# Patient Record
Sex: Male | Born: 1970
Health system: Southern US, Community
[De-identification: ages and names within clinical notes are randomized; demographics above are authoritative.]

## PROBLEM LIST (undated history)

## (undated) DIAGNOSIS — I10 Essential (primary) hypertension: Secondary | ICD-10-CM

## (undated) DIAGNOSIS — B353 Tinea pedis: Secondary | ICD-10-CM

## (undated) DIAGNOSIS — E119 Type 2 diabetes mellitus without complications: Secondary | ICD-10-CM

## (undated) DIAGNOSIS — E0590 Thyrotoxicosis, unspecified without thyrotoxic crisis or storm: Secondary | ICD-10-CM

## (undated) DIAGNOSIS — Z720 Tobacco use: Secondary | ICD-10-CM

## (undated) DIAGNOSIS — G4733 Obstructive sleep apnea (adult) (pediatric): Secondary | ICD-10-CM

## (undated) DIAGNOSIS — M1990 Unspecified osteoarthritis, unspecified site: Secondary | ICD-10-CM

## (undated) DIAGNOSIS — E669 Obesity, unspecified: Secondary | ICD-10-CM

## (undated) HISTORY — DX: Unspecified osteoarthritis, unspecified site: M19.90

## (undated) HISTORY — DX: Tinea pedis: B35.3

## (undated) HISTORY — DX: Obesity, unspecified: E66.9

## (undated) HISTORY — DX: Obstructive sleep apnea (adult) (pediatric): G47.33

## (undated) HISTORY — DX: Tobacco use: Z72.0

## (undated) HISTORY — DX: Thyrotoxicosis, unspecified without thyrotoxic crisis or storm: E05.90

## (undated) HISTORY — DX: Essential (primary) hypertension: I10

---

## 2005-02-08 ENCOUNTER — Encounter (HOSPITAL_COMMUNITY): Admission: RE | Admit: 2005-02-08 | Discharge: 2005-05-09 | Payer: Self-pay | Admitting: Endocrinology

## 2008-07-29 ENCOUNTER — Encounter: Admission: RE | Admit: 2008-07-29 | Discharge: 2008-07-29 | Payer: Self-pay | Admitting: Occupational Medicine

## 2009-01-07 ENCOUNTER — Emergency Department (HOSPITAL_COMMUNITY): Admission: EM | Admit: 2009-01-07 | Discharge: 2009-01-07 | Payer: Self-pay | Admitting: Emergency Medicine

## 2009-09-21 ENCOUNTER — Encounter: Admission: RE | Admit: 2009-09-21 | Discharge: 2009-09-21 | Payer: Self-pay | Admitting: Internal Medicine

## 2010-08-28 IMAGING — CR DG CHEST 2V
2 series · 2 of 2 positions shown · non-contrast
Comparison: None

CLINICAL DATA: Chest pain

CHEST - 2 VIEW

[w chest pa]
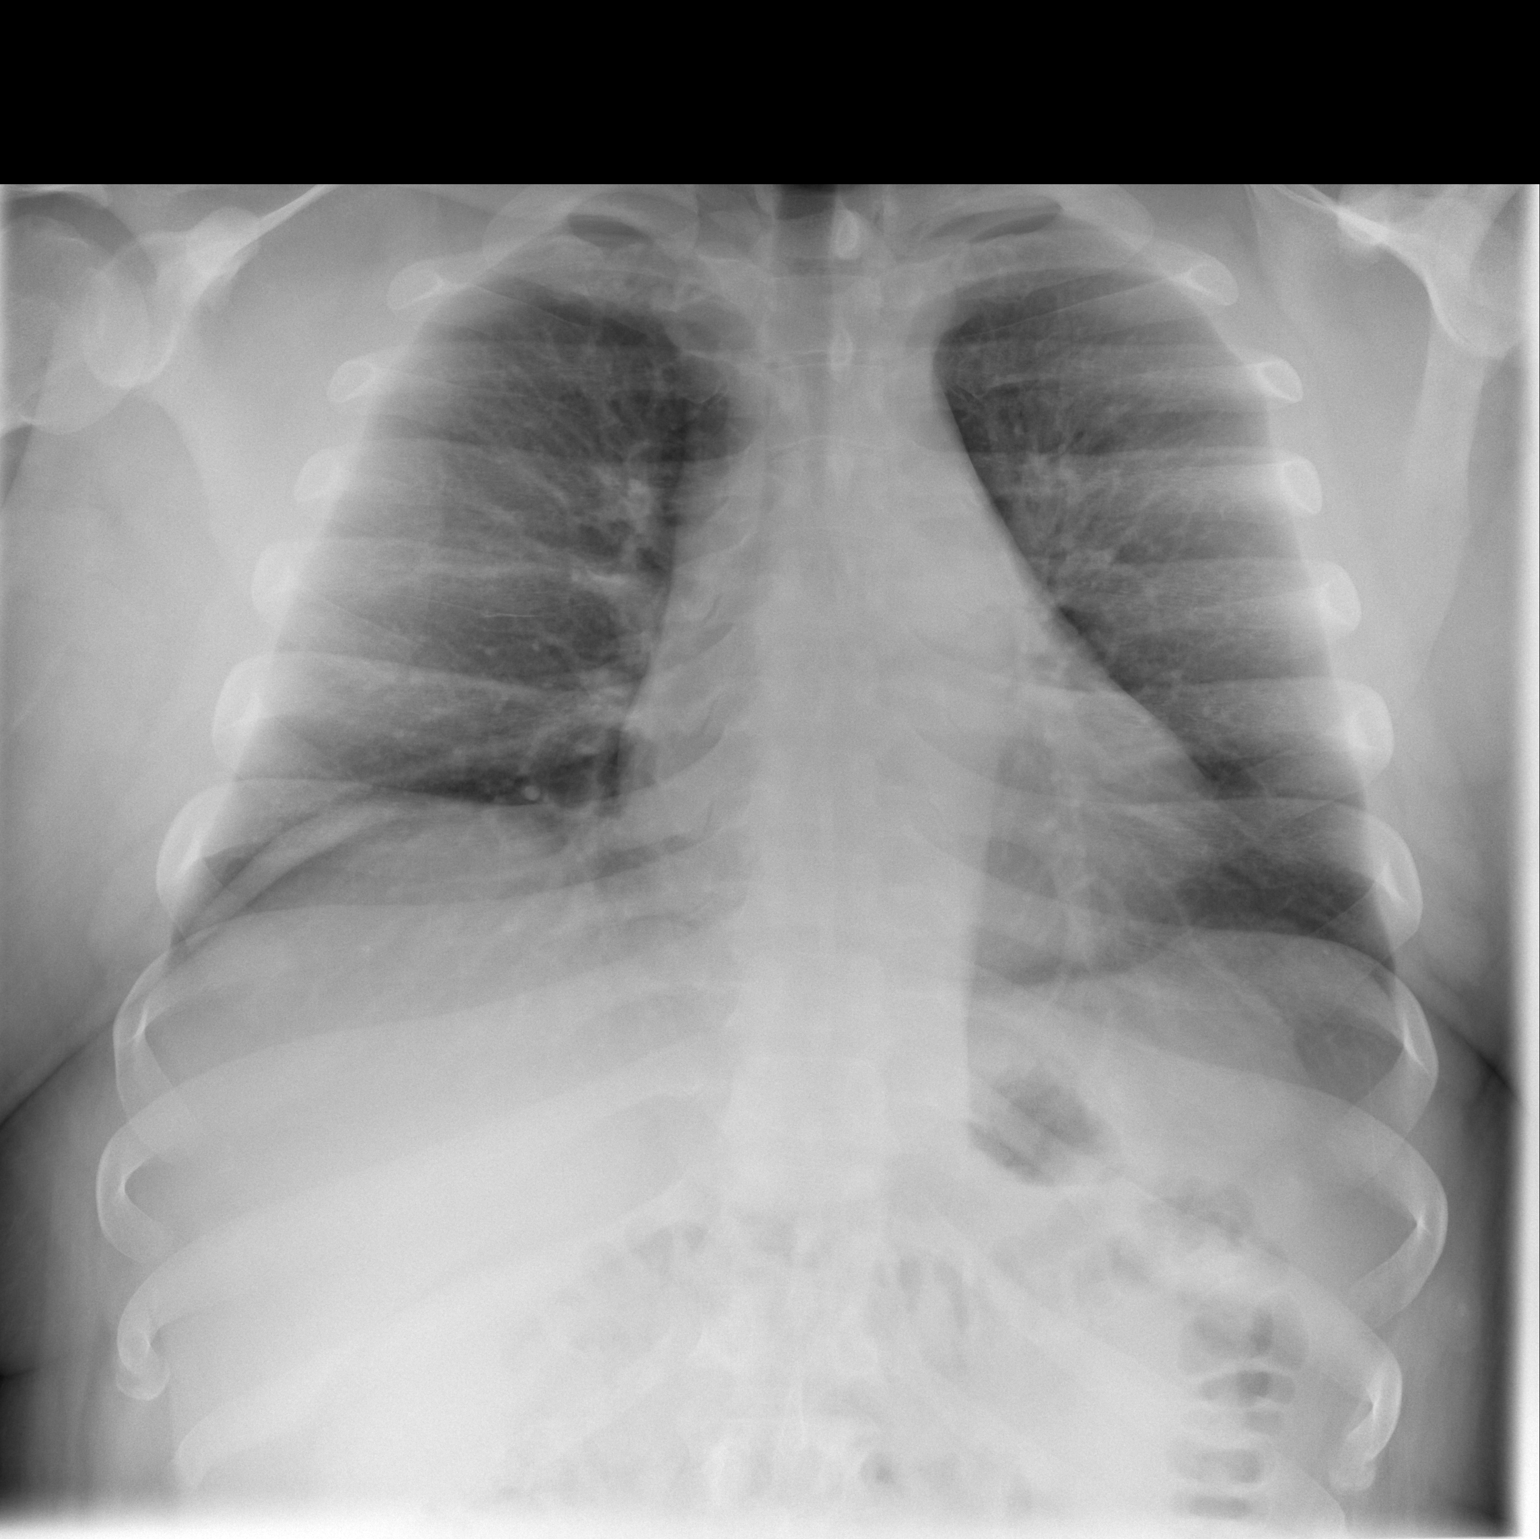

[w chest lat]
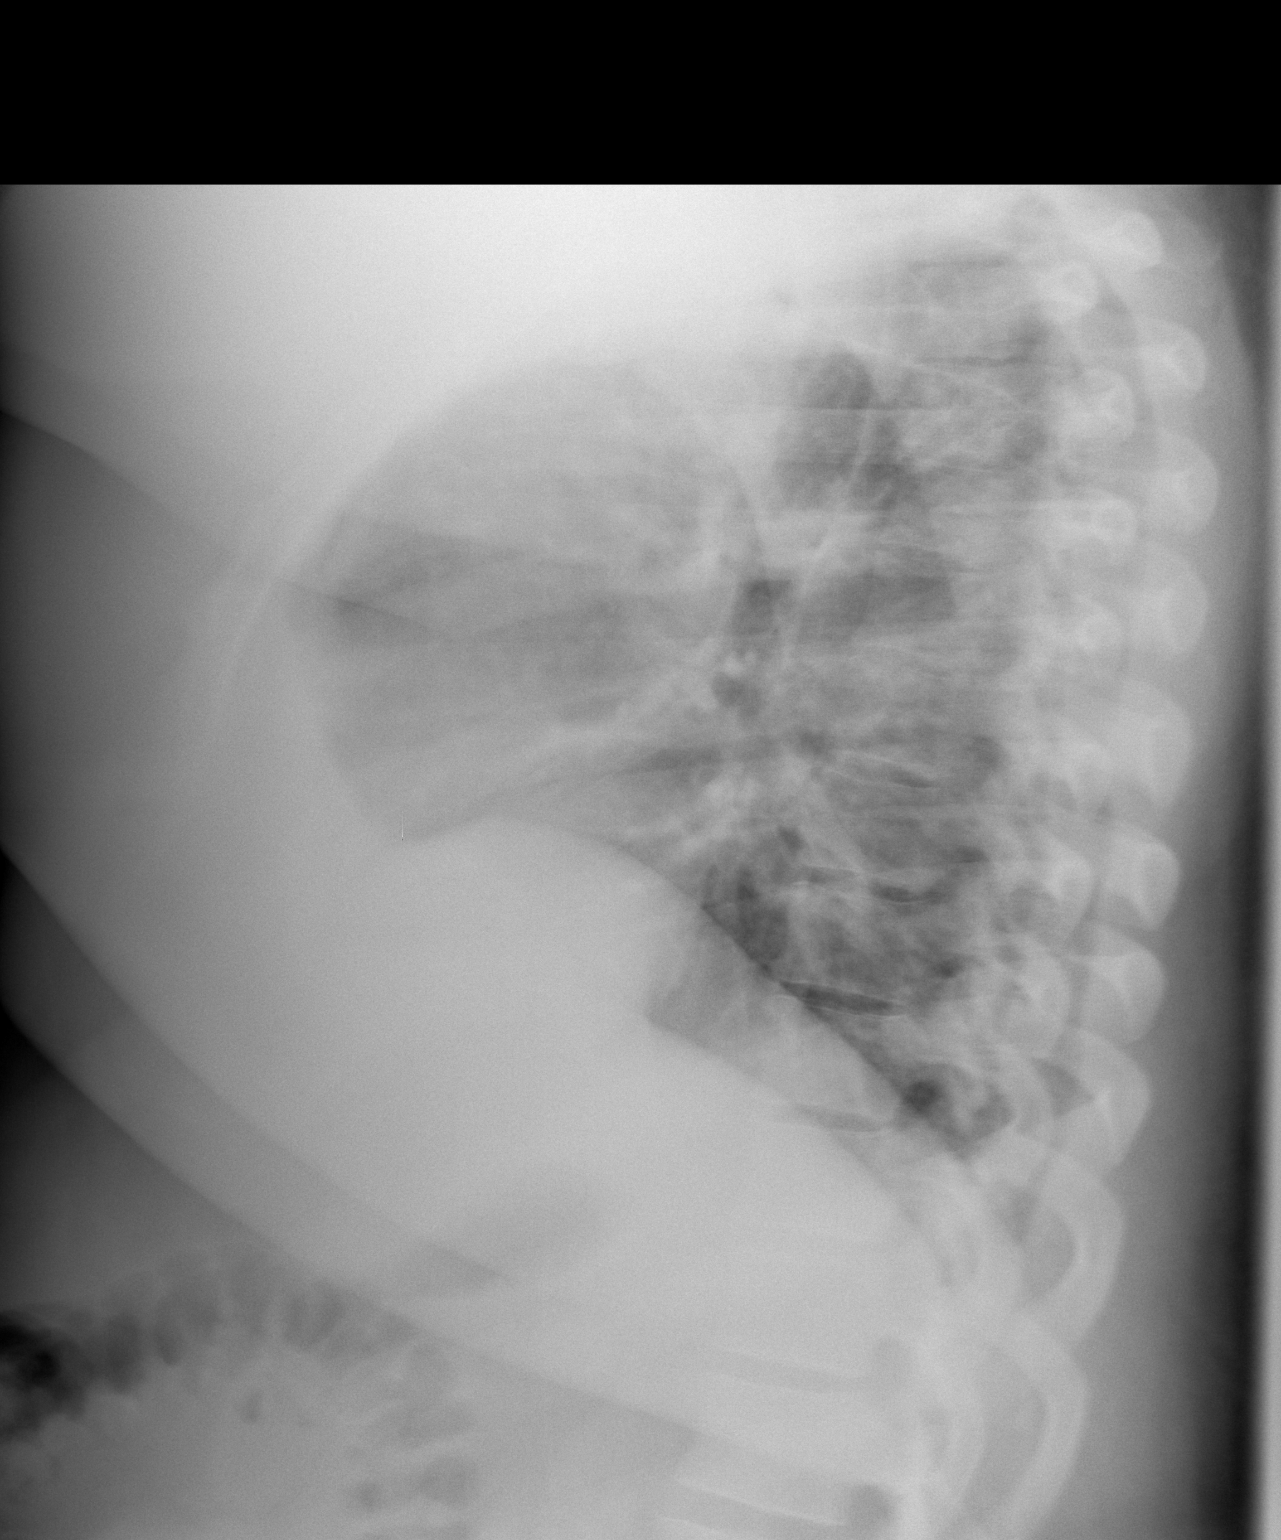

[2 of 2 positions shown; findings below may reference images not displayed]

FINDINGS: The heart size and mediastinal contours are within normal
limits.  Both lungs are clear.  The visualized skeletal structures
are unremarkable.
IMPRESSION: No acute cardiopulmonary abnormalities

## 2010-09-11 LAB — POCT I-STAT, CHEM 8
BUN: 11 mg/dL (ref 6–23)
Calcium, Ion: 0.98 mmol/L — ABNORMAL LOW (ref 1.12–1.32)
Creatinine, Ser: 0.9 mg/dL (ref 0.4–1.5)
Glucose, Bld: 98 mg/dL (ref 70–99)
Hemoglobin: 16.7 g/dL (ref 13.0–17.0)
Potassium: 4 mEq/L (ref 3.5–5.1)

## 2010-09-11 LAB — POCT CARDIAC MARKERS: CKMB, poc: 1.7 ng/mL (ref 1.0–8.0)

## 2011-01-07 ENCOUNTER — Ambulatory Visit
Admission: RE | Admit: 2011-01-07 | Discharge: 2011-01-07 | Disposition: A | Discharge status: Home / Self Care | Payer: 59 | Source: Ambulatory Visit | Attending: Family Medicine | Admitting: Family Medicine

## 2011-01-07 ENCOUNTER — Other Ambulatory Visit: Payer: Self-pay | Admitting: Family Medicine

## 2011-01-07 DIAGNOSIS — G44009 Cluster headache syndrome, unspecified, not intractable: Secondary | ICD-10-CM

## 2011-06-17 ENCOUNTER — Ambulatory Visit: Payer: 59 | Admitting: Family Medicine

## 2011-08-11 ENCOUNTER — Ambulatory Visit (INDEPENDENT_AMBULATORY_CARE_PROVIDER_SITE_OTHER): Payer: 59 | Admitting: Physician Assistant

## 2011-08-11 VITALS — BP 135/89 | HR 68 | Temp 98.2°F | Resp 20 | Ht 67.25 in | Wt 306.2 lb

## 2011-08-11 DIAGNOSIS — J069 Acute upper respiratory infection, unspecified: Secondary | ICD-10-CM

## 2011-08-11 DIAGNOSIS — H109 Unspecified conjunctivitis: Secondary | ICD-10-CM

## 2011-08-11 MED ORDER — KETOROLAC TROMETHAMINE 0.5 % OP SOLN: 1.0000 [drp] | Freq: Four times a day (QID) | OPHTHALMIC | Status: AC

## 2011-08-11 MED ORDER — IPRATROPIUM BROMIDE 0.03 % NA SOLN: 2.0000 | Freq: Two times a day (BID) | NASAL | Status: DC

## 2011-08-11 NOTE — Progress Notes (Signed)
Subjective:    Patient ID: Patrick Russell, male    DOB: [birth year], 41 y.o.   MRN: [id number]  HPI  This patient presents, accompanied by his wife, complaining of irritation and swelling of the left eye since approximately 2 AM. It woke him from his sleep. He denies any history of foreign body to the eye. No history of injury to the eye. Decreased visual acuity but light causes burning sensation in the eye. This morning the eye appeared red and it was crusted around the lashes. Since he cleans the lashes he has had clear drainage, like tears, draining but no purulence. He went to work but was sent home with possible pinkeye.  Review of Systems Nasal congestion, postnasal drainage, sinus pressure. Ear pain. Mild headache more on the left than the right. No cough. No chest pain, shortness of breath, GI or GU symptoms.    Objective:   Physical Exam Vital signs are noted. This is a well-developed, obese black male who is awake, alert and oriented in no acute distress. Swelling of the left upper eyelid is noted. The sclera and conjunctiva of the left eye are mildly injected. No cloudiness of the cornea. Her equal, round and reactive to light. Extraocular movements are intact. Funduscopic exam is normal. There is no purulence noted. Nasal mucosa is congested. Oropharynx is clear. TMs are clear bilaterally. No periarticular or cervical lymphadenopathy. Heart has a regular rate and rhythm. Lungs are clear bilaterally. Skin is warm and dry.       Assessment & Plan:  Viral conjunctivitis, early viral URI.  Acular ophthalmic drops 4 times a day. Atrovent nasal spray twice a day. Presses to the left eyelid. Reevaluate promptly if symptoms worsen or persist or he develops joint changes.

## 2011-08-11 NOTE — Patient Instructions (Signed)
Warm compress to the affected eye 2-3 times daily.  WASH YOUR HANDS! And try not to touch your eye (we don't want to get a bacterial infection on top!).

## 2011-08-25 ENCOUNTER — Encounter: Payer: 59 | Admitting: Physician Assistant

## 2011-09-23 ENCOUNTER — Other Ambulatory Visit: Payer: Self-pay | Admitting: Physician Assistant

## 2011-09-29 ENCOUNTER — Ambulatory Visit (INDEPENDENT_AMBULATORY_CARE_PROVIDER_SITE_OTHER): Payer: 59 | Admitting: Physician Assistant

## 2011-09-29 ENCOUNTER — Encounter: Payer: Self-pay | Admitting: Physician Assistant

## 2011-09-29 VITALS — BP 143/93 | HR 83 | Temp 97.3°F | Resp 16 | Ht 67.25 in | Wt 306.6 lb

## 2011-09-29 DIAGNOSIS — M109 Gout, unspecified: Secondary | ICD-10-CM

## 2011-09-29 DIAGNOSIS — R030 Elevated blood-pressure reading, without diagnosis of hypertension: Secondary | ICD-10-CM

## 2011-09-29 DIAGNOSIS — Z125 Encounter for screening for malignant neoplasm of prostate: Secondary | ICD-10-CM

## 2011-09-29 DIAGNOSIS — E669 Obesity, unspecified: Secondary | ICD-10-CM

## 2011-09-29 DIAGNOSIS — Z0000 Encounter for general adult medical examination without abnormal findings: Secondary | ICD-10-CM

## 2011-09-29 DIAGNOSIS — M25569 Pain in unspecified knee: Secondary | ICD-10-CM

## 2011-09-29 DIAGNOSIS — Z1211 Encounter for screening for malignant neoplasm of colon: Secondary | ICD-10-CM

## 2011-09-29 LAB — POCT URINALYSIS DIPSTICK
Bilirubin, UA: NEGATIVE
Nitrite, UA: NEGATIVE
Protein, UA: NEGATIVE
Spec Grav, UA: 1.025

## 2011-09-29 LAB — LIPID PANEL
HDL: 55 mg/dL (ref >39)
LDL Cholesterol: 120 mg/dL — ABNORMAL HIGH (ref 0–99)
Total CHOL/HDL Ratio: 4 Ratio
Triglycerides: 218 mg/dL — ABNORMAL HIGH (ref <150)
VLDL: 44 mg/dL — ABNORMAL HIGH (ref 0–40)

## 2011-09-29 LAB — POCT UA - MICROSCOPIC ONLY
Bacteria, U Microscopic: NEGATIVE
Casts, Ur, LPF, POC: NEGATIVE
Crystals, Ur, HPF, POC: NEGATIVE
Yeast, UA: NEGATIVE

## 2011-09-29 LAB — COMPREHENSIVE METABOLIC PANEL
ALT: 35 U/L (ref 0–53)
AST: 31 U/L (ref 0–37)
Albumin: 4.9 g/dL (ref 3.5–5.2)
BUN: 13 mg/dL (ref 6–23)
Calcium: 9.4 mg/dL (ref 8.4–10.5)
Creat: 0.79 mg/dL (ref 0.50–1.35)
Sodium: 140 mEq/L (ref 135–145)
Total Bilirubin: 0.6 mg/dL (ref 0.3–1.2)

## 2011-09-29 LAB — CBC WITH DIFFERENTIAL/PLATELET
Basophils Absolute: 0.1 10*3/uL (ref 0.0–0.1)
Eosinophils Absolute: 0.2 10*3/uL (ref 0.0–0.7)
Eosinophils Relative: 2 % (ref 0–5)
HCT: 44.1 % (ref 39.0–52.0)
Hemoglobin: 14.6 g/dL (ref 13.0–17.0)
Lymphocytes Relative: 27 % (ref 12–46)
MCHC: 33.1 g/dL (ref 30.0–36.0)
MCV: 80.5 fL (ref 78.0–100.0)
Monocytes Absolute: 0.7 10*3/uL (ref 0.1–1.0)
RBC: 5.48 MIL/uL (ref 4.22–5.81)
WBC: 8.3 10*3/uL (ref 4.0–10.5)

## 2011-09-29 LAB — PSA: PSA: 1.05 ng/mL (ref <=4.00)

## 2011-09-29 NOTE — Progress Notes (Signed)
Subjective:    Patient ID: Patrick Russell, male    DOB: 1970/08/28, 41 y.o.   MRN: [id number]  HPI Presents for re-evaluation of HTN.  Noted at his recent visit with conjunctivitis.  Previously told he had HTN, but no treatment in quite some time. He smokes.  Working on quitting.  Also knows he needs to lose weight.   Review of Systems No chest pain, SOB, HA, dizziness, vision change, N/V, diarrhea, dysuria, myalgias, arthralgias or rash.     Objective:   Physical Exam  Vital signs noted. Well-developed, well nourished obese BM who is awake, alert and oriented, in NAD. HEENT: [place]/AT, PERRL, EOMI.  Sclera and conjunctiva are clear.  EAC are patent, TMs are normal in appearance. Nasal mucosa is pink and moist. OP is clear. Neck: supple, non-tender, no lymphadenopathey, thyromegaly. Heart: RRR, no murmur Lungs: CTA Abdomen: normo-active bowel sounds, supple, non-tender, no mass or organomegaly. Extremities: no cyanosis, clubbing or edema. Skin: warm and dry without rash. Hyperpigmentation on the central forehead consistent with sleeping prone with forehead rested on hands.  Results for orders placed in visit on 09/29/11  IFOBT (OCCULT BLOOD)      Component Value Range   IFOBT Negative    POCT UA - MICROSCOPIC ONLY      Component Value Range   WBC, Ur, HPF, POC 0-2     RBC, urine, microscopic 1-3     Bacteria, U Microscopic neg     Mucus, UA moderate     Epithelial cells, urine per micros 0-2     Crystals, Ur, HPF, POC neg     Casts, Ur, LPF, POC neg     Yeast, UA neg    POCT URINALYSIS DIPSTICK      Component Value Range   Color, UA yellow     Clarity, UA clear     Glucose, UA neg     Bilirubin, UA neg     Ketones, UA neg     Spec Grav, UA 1.025     Blood, UA trace     pH, UA 5.5     Protein, UA neg     Urobilinogen, UA 0.2     Nitrite, UA neg     Leukocytes, UA Negative    GLUCOSE, POCT (MANUAL RESULT ENTRY)      Component Value Range   POC Glucose 87    CBC WITH  DIFFERENTIAL      Component Value Range   WBC 8.3  4.0 - 10.5 (K/uL)   RBC 5.48  4.22 - 5.81 (MIL/uL)   Hemoglobin 14.6  13.0 - 17.0 (g/dL)   HCT 62.9  52.8 - 41.3 (%)   MCV 80.5  78.0 - 100.0 (fL)   MCH 26.6  26.0 - 34.0 (pg)   MCHC 33.1  30.0 - 36.0 (g/dL)   RDW 24.4  01.0 - 27.2 (%)   Platelets 333  150 - 400 (K/uL)   Neutrophils Relative 61  43 - 77 (%)   Neutro Abs 5.1  1.7 - 7.7 (K/uL)   Lymphocytes Relative 27  12 - 46 (%)   Lymphs Abs 2.2  0.7 - 4.0 (K/uL)   Monocytes Relative 9  3 - 12 (%)   Monocytes Absolute 0.7  0.1 - 1.0 (K/uL)   Eosinophils Relative 2  0 - 5 (%)   Eosinophils Absolute 0.2  0.0 - 0.7 (K/uL)   Basophils Relative 1  0 - 1 (%)   Basophils Absolute 0.1  0.0 - 0.1 (K/uL)   Smear Review Criteria for review not met    COMPREHENSIVE METABOLIC PANEL      Component Value Range   Sodium 140  135 - 145 (mEq/L)   Potassium 3.6  3.5 - 5.3 (mEq/L)   Chloride 101  96 - 112 (mEq/L)   CO2 27  19 - 32 (mEq/L)   Glucose, Bld 97  70 - 99 (mg/dL)   BUN 13  6 - 23 (mg/dL)   Creat 5.62  1.30 - 8.65 (mg/dL)   Total Bilirubin 0.6  0.3 - 1.2 (mg/dL)   Alkaline Phosphatase 61  39 - 117 (U/L)   AST 31  0 - 37 (U/L)   ALT 35  0 - 53 (U/L)   Total Protein 7.4  6.0 - 8.3 (g/dL)   Albumin 4.9  3.5 - 5.2 (g/dL)   Calcium 9.4  8.4 - 78.4 (mg/dL)  LIPID PANEL      Component Value Range   Cholesterol 219 (*) 0 - 200 (mg/dL)   Triglycerides 696 (*) <150 (mg/dL)   HDL 55  >29 (mg/dL)   Total CHOL/HDL Ratio 4.0     VLDL 44 (*) 0 - 40 (mg/dL)   LDL Cholesterol 528 (*) 0 - 99 (mg/dL)  PSA      Component Value Range   PSA 1.05  <=4.00 (ng/mL)  TSH      Component Value Range   TSH 0.207 (*) 0.350 - 4.500 (uIU/mL)        Assessment & Plan:   1. Routine general medical examination at a health care facility  POCT UA - Microscopic Only, POCT urinalysis dipstick, POCT glucose (manual entry), CBC with Differential, Comprehensive metabolic panel, Lipid panel  2. Knee pain    3.  Gout    4. Screening for prostate cancer  PSA  5. Screening for colon cancer  IFOBT POC (occult bld, rslt in office)  6. Obesity  Lipid panel, TSH  7. Elevated BP  CBC with Differential, TSH   Increase efforts for smoking cessation and weight loss.  Recheck TSH, BP in 6 weeks.

## 2011-09-29 NOTE — Patient Instructions (Signed)
Work extra hard on healthy eating and regular exercise.  If your blood pressure doesn't improve in 6 weeks, we'll adjust your blood pressure medication.

## 2011-09-30 ENCOUNTER — Encounter: Payer: Self-pay | Admitting: Physician Assistant

## 2011-10-11 ENCOUNTER — Telehealth: Payer: Self-pay

## 2011-10-11 MED ORDER — AMLODIPINE-OLMESARTAN 5-20 MG PO TABS: 1.0000 | ORAL_TABLET | Freq: Every day | ORAL | Status: DC

## 2011-10-11 NOTE — Telephone Encounter (Signed)
Can we prescribe him a bp med?

## 2011-10-11 NOTE — Telephone Encounter (Signed)
Rx sent for RF of his AZOR.  He's to F/U mid-June for a re-check.

## 2011-10-11 NOTE — Telephone Encounter (Signed)
Patrick Russell,  Patient is requesting a rx for blood pressure medication.  [phone]

## 2011-10-12 NOTE — Telephone Encounter (Signed)
LMOM THAT RX WAS SENT IN 

## 2011-11-05 ENCOUNTER — Ambulatory Visit (INDEPENDENT_AMBULATORY_CARE_PROVIDER_SITE_OTHER): Payer: 59 | Admitting: Physician Assistant

## 2011-11-05 VITALS — BP 140/92 | HR 87 | Temp 97.9°F | Resp 20 | Ht 67.5 in | Wt 305.4 lb

## 2011-11-05 DIAGNOSIS — R21 Rash and other nonspecific skin eruption: Secondary | ICD-10-CM

## 2011-11-05 DIAGNOSIS — J029 Acute pharyngitis, unspecified: Secondary | ICD-10-CM

## 2011-11-05 DIAGNOSIS — R05 Cough: Secondary | ICD-10-CM

## 2011-11-05 DIAGNOSIS — J019 Acute sinusitis, unspecified: Secondary | ICD-10-CM

## 2011-11-05 LAB — POCT CBC
Hemoglobin: 14.6 g/dL (ref 14.1–18.1)
Lymph, poc: 2.3 (ref 0.6–3.4)
MCH, POC: 26.6 pg — AB (ref 27–31.2)
MCV: 83.5 fL (ref 80–97)
MPV: 8 fL (ref 0–99.8)
POC Granulocyte: 5.6 (ref 2–6.9)
POC MID %: 8.3 %M (ref 0–12)
RDW, POC: 14.8 %

## 2011-11-05 MED ORDER — AMOXICILLIN-POT CLAVULANATE 875-125 MG PO TABS: 1.0000 | ORAL_TABLET | Freq: Two times a day (BID) | ORAL | Status: AC

## 2011-11-05 MED ORDER — HYDROCODONE-HOMATROPINE 5-1.5 MG/5ML PO SYRP: ORAL_SOLUTION | ORAL | Status: AC

## 2011-11-05 NOTE — Progress Notes (Signed)
Patient ID: Patrick Russell MRN: [id number], DOB: [birth year], 41 y.o. Date of Encounter: 11/05/2011, 11:35 AM  Primary Physician: No primary provider on file.  Chief Complaint:  Chief Complaint  Patient presents with  . Cough    prod. green phlegm x 2 days  . Sore Throat    glands swollen, hurt to swallow  . Fever    HPI: 41 y.o. year old male presents with a two day history of nasal congestion, post nasal drip, sore throat, sinus pressure, and cough. Subjective fever and chills. Nasal congestion thick and green/yellow. Cough is productive of green/yellow sputum and not associated with time of day. Sore throat is the worst symptom. Hurts to swallow, but able to swallow. Ears feel full, leading to sensation of muffled hearing. Has tried OTC cold preps without success. No GI complaints. Appetite normal.  No sick contacts, recent antibiotics, or recent travels.   No leg trauma, sedentary periods, or h/o cancer.  Past Medical History  Diagnosis Date  . Gout   . Hypertension   . Obesity      Home Meds: Prior to Admission medications   Medication Sig Start Date End Date Taking? Authorizing Provider  allopurinol (ZYLOPRIM) 300 MG tablet TAKE 1 TABLET BY MOUTH EVERY DAY 09/23/11  Yes Chelle S Jeffery, PA-C  amLODipine-olmesartan (AZOR) 5-20 MG per tablet Take 1 tablet by mouth daily. 10/11/11  Yes Chelle S Jeffery, PA-C  Cholecalciferol (VITAMIN D-3 PO) Take 5,000 mg by mouth daily.   Yes Historical Provider, MD  colchicine 0.6 MG tablet Take 0.6 mg by mouth as needed.   Yes Historical Provider, MD  fish oil-omega-3 fatty acids 1000 MG capsule Take 1 g by mouth daily.   Yes Historical Provider, MD  vitamin B-12 (CYANOCOBALAMIN) 1000 MCG tablet Take 1,000 mcg by mouth daily.   Yes Historical Provider, MD                  Allergies: No Known Allergies  History   Social History  . Marital Status: Married    Spouse Name: N/A    Number of Children: N/A  . Years of Education: N/A    Occupational History  . Not on file.   Social History Main Topics  . Smoking status: Current Some Day Smoker -- 0.5 packs/day    Types: Cigarettes  . Smokeless tobacco: Not on file  . Alcohol Use: Not on file  . Drug Use: Not on file  . Sexually Active: Not on file   Other Topics Concern  . Not on file   Social History Narrative  . No narrative on file     Review of Systems: Constitutional: negative for night sweats or weight changes Cardiovascular: negative for chest pain or palpitations Respiratory: negative for hemoptysis, wheezing, or shortness of breath Abdominal: negative for abdominal pain, nausea, vomiting or diarrhea Dermatological: negative for rash Neurologic: negative for headache   Physical Exam: Blood pressure 140/92, pulse 87, temperature 97.9 F (36.6 C), temperature source Oral, resp. rate 20, height 5' 7.5" (1.715 m), weight 305 lb 6.4 oz (138.529 kg)., Body mass index is 47.13 kg/(m^2). General: Well developed, well nourished, in no acute distress. Head: Normocephalic, atraumatic, eyes without discharge, sclera non-icteric, nares are congested. Bilateral auditory canals clear, TM's are without perforation, pearly grey with reflective cone of light bilaterally. Serous effusion bilaterally behind TM's. Maxillary sinus TTP. Oral cavity moist, dentition normal. Posterior pharynx with post nasal drip and mild erythema. No peritonsillar abscess or tonsillar  exudate. Neck: Supple. No thyromegaly. Full ROM. No lymphadenopathy. Lungs: Coarse breath sounds bilaterally without wheezes, rales, or rhonchi. Breathing is unlabored.  Heart: RRR with S1 S2. No murmurs, rubs, or gallops appreciated. Msk:  Strength and tone normal for age. Extremities: No clubbing or cyanosis. No edema. Neuro: Alert and oriented X 3. Moves all extremities spontaneously. CNII-XII grossly in tact. Psych:  Responds to questions appropriately with a normal affect.   Labs: Results for orders  placed in visit on 11/05/11  POCT CBC      Component Value Range   WBC 8.7  4.6 - 10.2 (K/uL)   Lymph, poc 2.3  0.6 - 3.4    POC LYMPH PERCENT 26.9  10 - 50 (%L)   MID (cbc) 0.7  0 - 0.9    POC MID % 8.3  0 - 12 (%M)   POC Granulocyte 5.6  2 - 6.9    Granulocyte percent 64.8  37 - 80 (%G)   RBC 5.48  4.69 - 6.13 (M/uL)   Hemoglobin 14.6  14.1 - 18.1 (g/dL)   HCT, POC 29.5  62.1 - 53.7 (%)   MCV 83.5  80 - 97 (fL)   MCH, POC 26.6 (*) 27 - 31.2 (pg)   MCHC 31.9  31.8 - 35.4 (g/dL)   RDW, POC 30.8     Platelet Count, POC 418  142 - 424 (K/uL)   MPV 8.0  0 - 99.8 (fL)  POCT RAPID STREP A (OFFICE)      Component Value Range   Rapid Strep A Screen Negative  Negative    TC pending  ASSESSMENT AND PLAN:  41 y.o. year old male with sinobronchitis -Augmentin 875/125mg  #20 1 po bid no RF -Hycodan #4oz 1 tsp po q 4-6 hours prn cough no RF SED -Mucinex -Tylenol/Motrin prn -Rest/fluids -RTC precautions -RTC 3-5 days if no improvement  Signed, Eula Listen, PA-C 11/05/2011 11:35 AM

## 2011-11-07 LAB — CULTURE, GROUP A STREP: Organism ID, Bacteria: NORMAL

## 2011-11-07 NOTE — Progress Notes (Signed)
Quick Note:  Await final results.  Eula Listen, PA-C 11/07/2011 9:23 AM ______

## 2011-11-14 ENCOUNTER — Telehealth: Payer: Self-pay

## 2011-11-14 NOTE — Telephone Encounter (Signed)
Chelle,  Patient received notice from his insurance company saying they will no longer cover his mediciation amLODipine-olmesartan (AZOR) 5-20 MG per tablet Please contact patient to advise his options  [phone]

## 2011-11-16 MED ORDER — LOSARTAN POTASSIUM 50 MG PO TABS: 50.0000 mg | ORAL_TABLET | Freq: Every day | ORAL | Status: DC

## 2011-11-16 MED ORDER — AMLODIPINE BESYLATE 5 MG PO TABS: 5.0000 mg | ORAL_TABLET | Freq: Every day | ORAL | Status: DC

## 2011-11-16 NOTE — Telephone Encounter (Signed)
We can switch to amlodipine and a generic alternative to olmesartan, losartan. I've sent them electronically for him. He should return in 4-6 weeks for a BP recheck to make sure it's controlled on the switch.

## 2011-11-16 NOTE — Telephone Encounter (Signed)
Pt has appt tom and will discuss tom.  Advised pt that we sent in a couple of rx's.

## 2011-11-17 ENCOUNTER — Ambulatory Visit: Payer: 59 | Admitting: Physician Assistant

## 2012-06-23 ENCOUNTER — Other Ambulatory Visit: Payer: Self-pay | Admitting: Physician Assistant

## 2012-06-23 NOTE — Telephone Encounter (Signed)
Needs office visit.

## 2012-09-14 ENCOUNTER — Other Ambulatory Visit: Payer: Self-pay | Admitting: Physician Assistant

## 2012-09-14 NOTE — Telephone Encounter (Signed)
Due for OV/labs

## 2012-10-30 ENCOUNTER — Other Ambulatory Visit: Payer: Self-pay | Admitting: Physician Assistant

## 2013-06-06 ENCOUNTER — Ambulatory Visit (INDEPENDENT_AMBULATORY_CARE_PROVIDER_SITE_OTHER): Payer: 59 | Admitting: Physician Assistant

## 2013-06-06 VITALS — BP 148/100 | HR 86 | Temp 99.3°F | Resp 16 | Ht 67.5 in | Wt 307.0 lb

## 2013-06-06 DIAGNOSIS — J329 Chronic sinusitis, unspecified: Secondary | ICD-10-CM

## 2013-06-06 DIAGNOSIS — J40 Bronchitis, not specified as acute or chronic: Principal | ICD-10-CM

## 2013-06-06 DIAGNOSIS — R0609 Other forms of dyspnea: Secondary | ICD-10-CM

## 2013-06-06 DIAGNOSIS — R0989 Other specified symptoms and signs involving the circulatory and respiratory systems: Secondary | ICD-10-CM

## 2013-06-06 DIAGNOSIS — R0683 Snoring: Secondary | ICD-10-CM

## 2013-06-06 MED ORDER — GUAIFENESIN ER 1200 MG PO TB12: 1.0000 | ORAL_TABLET | Freq: Two times a day (BID) | ORAL | Status: DC | PRN

## 2013-06-06 MED ORDER — CEFDINIR 300 MG PO CAPS: 600.0000 mg | ORAL_CAPSULE | Freq: Every day | ORAL | Status: DC

## 2013-06-06 MED ORDER — HYDROCOD POLST-CHLORPHEN POLST 10-8 MG/5ML PO LQCR: 5.0000 mL | Freq: Two times a day (BID) | ORAL | Status: DC | PRN

## 2013-06-06 MED ORDER — IPRATROPIUM BROMIDE 0.03 % NA SOLN: 2.0000 | Freq: Two times a day (BID) | NASAL | Status: DC

## 2013-06-06 NOTE — Progress Notes (Signed)
Subjective:    Patient ID: Patrick Russell, male    DOB: 1970/08/10, 43 y.o.   MRN: [id number]  HPI   PCP: No primary provider on file.  Chief Complaint  Patient presents with  . Generalized Body Aches    X 4 DAY  . Cough    Gradual onset over several days, beginning 05/29/2013, but worse x the past 4 days. Cough, slight HA, then fever (now resolved), then muscle aches.  Then developed chest congestion.  Started Mucinex, which helped "break it up."  Coughing jags causes rib pain, back pain and dizziness.  The mucous is really green.   Has nasal congestion, no facial pressure.  "The mucous sits in my chest at night and I can't breathe."  Diarrhea developed yesterday.  No nausea/vomiting.  Has not taken his regular medications today. Snores.  Wife isn't sure if he has any pauses in breathing-she was just diagnosed with OSA and is in the process of getting CPAP set up.  The patient wakes from sleep coughing, even prior to this illness, but reports that he awakens early, feeling rested and doesn't feel drowsy in the afternoons.  Review of Systems As above.    Objective:   Physical Exam Blood pressure 148/100, pulse 86, temperature 99.3 F (37.4 C), temperature source Oral, resp. rate 16, height 5' 7.5" (1.715 m), weight 307 lb (139.254 kg), SpO2 95.00%. Body mass index is 47.35 kg/(m^2). Well-developed, well nourished obese BM who is awake, alert and oriented, in NAD. HEENT: [place]/AT, PERRL, EOMI.  Sclera and conjunctiva are clear.  EAC are patent, TMs are normal in appearance. Nasal mucosa is congested, pink and moist. OP is clear. Neck: supple, non-tender, no lymphadenopathy, thyromegaly. Heart: RRR, no murmur Lungs: normal effort, CTA Extremities: no cyanosis, clubbing or edema. Skin: warm and dry without rash. Psychologic: good mood and appropriate affect, normal speech and behavior.        Assessment & Plan:  1. Sinobronchitis Supportive care.  Anticipatory guidance.  RTC  if symptoms worsen/persist. - ipratropium (ATROVENT) 0.03 % nasal spray; Place 2 sprays into both nostrils 2 (two) times daily.  Dispense: 30 mL; Refill: 0 - Guaifenesin (MUCINEX MAXIMUM STRENGTH) 1200 MG TB12; Take 1 tablet (1,200 mg total) by mouth every 12 (twelve) hours as needed.  Dispense: 14 tablet; Refill: 1 - chlorpheniramine-HYDROcodone (TUSSIONEX PENNKINETIC ER) 10-8 MG/5ML LQCR; Take 5 mLs by mouth every 12 (twelve) hours as needed for cough (cough).  Dispense: 100 mL; Refill: 0 - cefdinir (OMNICEF) 300 MG capsule; Take 2 capsules (600 mg total) by mouth daily.  Dispense: 20 capsule; Refill: 0  2. Snoring Suspect OSA - Ambulatory referral to Sleep Studies  Patient Instructions  Get plenty of rest and drink at least 64 ounces of water daily. My office will contact you to schedule your physical.  If you haven't heard from them in 1 week, please call us.    Fernande Bras, PA-C Physician Assistant-Certified [facility]

## 2013-06-06 NOTE — Patient Instructions (Addendum)
Get plenty of rest and drink at least 64 ounces of water daily. My office will contact you to schedule your physical.  If you haven't heard from them in 1 week, please call us.

## 2013-06-11 ENCOUNTER — Ambulatory Visit (INDEPENDENT_AMBULATORY_CARE_PROVIDER_SITE_OTHER): Payer: 59 | Admitting: Emergency Medicine

## 2013-06-11 VITALS — BP 148/92 | HR 79 | Temp 98.2°F | Resp 18 | Ht 67.5 in | Wt 310.0 lb

## 2013-06-11 DIAGNOSIS — J189 Pneumonia, unspecified organism: Secondary | ICD-10-CM

## 2013-06-11 DIAGNOSIS — J019 Acute sinusitis, unspecified: Secondary | ICD-10-CM

## 2013-06-11 DIAGNOSIS — J329 Chronic sinusitis, unspecified: Secondary | ICD-10-CM

## 2013-06-11 DIAGNOSIS — J209 Acute bronchitis, unspecified: Secondary | ICD-10-CM

## 2013-06-11 DIAGNOSIS — J40 Bronchitis, not specified as acute or chronic: Secondary | ICD-10-CM

## 2013-06-11 MED ORDER — PSEUDOEPHEDRINE-GUAIFENESIN ER 60-600 MG PO TB12: 1.0000 | ORAL_TABLET | Freq: Two times a day (BID) | ORAL | Status: DC

## 2013-06-11 MED ORDER — HYDROCOD POLST-CHLORPHEN POLST 10-8 MG/5ML PO LQCR
5.0000 mL | Freq: Two times a day (BID) | ORAL | Status: DC | PRN
Start: 2013-06-11 — End: 2013-07-16

## 2013-06-11 MED ORDER — LEVOFLOXACIN 500 MG PO TABS
500.0000 mg | ORAL_TABLET | Freq: Every day | ORAL | Status: AC
Start: 2013-06-11 — End: 2013-06-21

## 2013-06-11 MED ORDER — ALBUTEROL SULFATE HFA 108 (90 BASE) MCG/ACT IN AERS: 2.0000 | INHALATION_SPRAY | RESPIRATORY_TRACT | Status: DC | PRN

## 2013-06-11 NOTE — Progress Notes (Signed)
[facility] [address], [place] [address] [phone]  Date:  06/11/2013   Name:  Patrick Russell   DOB:  [birth year]   MRN:  [id number]  PCP:  No primary provider on file.    Chief Complaint: Fever, Diarrhea, Nasal Congestion and Cough   History of Present Illness:  Patrick Russell is a 43 y.o. very pleasant male patient who presents with the following:  Ill with green nasal drainage and post nasal drip and purulent sputum with wheezing.  Seen on 1/1 and put on cefdinir with no improvement.  No fever or chills.  No nausea or vomiting or rash.  Feels hot and cold.  Increased sputum production and exertional shortness of breath.  No improvement with over the counter medications or other home remedies. Denies other complaint or health concern today.   Patient Active Problem List   Diagnosis Date Noted  . Obesity     Past Medical History  Diagnosis Date  . Gout   . Hypertension   . Obesity     History reviewed. No pertinent past surgical history.  History  Substance Use Topics  . Smoking status: Current Some Day Smoker -- 0.50 packs/day    Types: Cigarettes  . Smokeless tobacco: Never Used  . Alcohol Use: 3.0 oz/week    6 drink(s) per week    Family History  Problem Relation Age of Onset  . Hypertension Mother   . Diabetes Mother   . Cancer Father     prostate  . Diabetes Father   . Hypertension Father   . Diabetes Brother   . Hypertension Brother     No Known Allergies  Medication list has been reviewed and updated.  Current Outpatient Prescriptions on File Prior to Visit  Medication Sig Dispense Refill  . amLODipine (NORVASC) 5 MG tablet Take 1 tablet (5 mg total) by mouth daily. PATIENT NEEDS OFFICE VISIT FOR ADDITIONAL REFILLS - 2nd NOTICE!  15 tablet  0  . cefdinir (OMNICEF) 300 MG capsule Take 2 capsules (600 mg total) by mouth daily.  20 capsule  0  . chlorpheniramine-HYDROcodone (TUSSIONEX PENNKINETIC ER) 10-8 MG/5ML LQCR Take 5  mLs by mouth every 12 (twelve) hours as needed for cough (cough).  100 mL  0  . colchicine 0.6 MG tablet Take 0.6 mg by mouth as needed.      Marland Kitchen ipratropium (ATROVENT) 0.03 % nasal spray Place 2 sprays into both nostrils 2 (two) times daily.  30 mL  0  . losartan (COZAAR) 50 MG tablet Take 1 tablet (50 mg total) by mouth daily. PATIENT NEEDS OFFICE VISIT FOR ADDITIONAL REFILLS - 2nd notice!  15 tablet  0  . allopurinol (ZYLOPRIM) 300 MG tablet TAKE 1 TABLET BY MOUTH EVERY DAY  30 tablet  0  . Cholecalciferol (VITAMIN D-3 PO) Take 5,000 mg by mouth daily.      . Cranberry 400 MG TABS Take 400 mg by mouth every other day.      . Guaifenesin (MUCINEX MAXIMUM STRENGTH) 1200 MG TB12 Take 1 tablet (1,200 mg total) by mouth every 12 (twelve) hours as needed.  14 tablet  1   No current facility-administered medications on file prior to visit.    Review of Systems:  As per HPI, otherwise negative.    Physical Examination: Filed Vitals:   06/11/13 0954  BP: 148/92  Pulse: 79  Temp: 98.2 F (36.8 C)  Resp: 18   Filed Vitals:   06/11/13  0954  Height: 5' 7.5" (1.715 m)  Weight: 310 lb (140.615 kg)   Body mass index is 47.81 kg/(m^2). Ideal Body Weight: Weight in (lb) to have BMI = 25: 161.7  GEN: obese, moderate distress, Non-toxic, A & O x 3 HEENT: Atraumatic, Normocephalic. Neck supple. No masses, No LAD. Ears and Nose: No external deformity. CV: RRR, No M/G/R. No JVD. No thrill. No extra heart sounds. PULM: poor air movement CTA B, no wheezes, crackles, rhonchi. No retractions. No resp. distress. No accessory muscle use. ABD: S, NT, ND, +BS. No rebound. No HSM. EXTR: No c/c/e NEURO Normal gait.  PSYCH: Normally interactive. Conversant. Not depressed or anxious appearing.  Calm demeanor.    Assessment and Plan: Sinusitis Bronchitis levaquin mucinex d Albuterol  Signed,  Phillips Odor, MD

## 2013-06-11 NOTE — Patient Instructions (Signed)
Metered Dose Inhaler (No Spacer Used) Inhaled medicines are the basis of asthma treatment and other breathing problems. Inhaled medicine can only be effective if used properly. Good technique assures that the medicine reaches the lungs. Metered dose inhalers (MDIs) are used to deliver a variety of inhaled medicines. These include quick relief or rescue medicines (such as bronchodilators) and controller medicines (such as corticosteroids). The medicine is delivered by pushing down on a metal canister to release a set amount of spray. If you are using different kinds of inhalers, use your quick relief medicine to open the airways 10 15 minutes before using a steroid if instructed to do so by your health care provider. If you are unsure which inhalers to use and the order of using them, ask your health care provider, nurse, or respiratory therapist. HOW TO USE THE INHALER 1. Remove cap from inhaler. 2. If you are using the inhaler for the first time, you will need to prime it. Shake the inhaler for 5 seconds and release four puffs into the air, away from your face. Ask your health care provider or pharmacist if you have questions about priming your inhaler. 3. Shake inhaler for 5 seconds before each breath in (inhalation). 4. Position the inhaler so that the top of the canister faces up. 5. Put your index finger on the top of the medicine canister. Your thumb supports the bottom of the inhaler. 6. Open your mouth. 7. Either place the inhaler between your teeth and place your lips tightly around the mouthpiece, or hold the inhaler 1 2 inches away from your open mouth. If you are unsure of which technique to use, ask your health care provider. 8. Breathe out (exhale) normally and as completely as possible. 9. Press the canister down with the index finger to release the medicine. 10. At the same time as the canister is pressed, inhale deeply and slowly until the lungs are completely filled. This should take  4 6 seconds. Keep your tongue down. 11. Hold the medicine in your lungs for up to 5 10 seconds (10 seconds is best). This helps the medicine get into the small airways of your lungs. 12. Breathe out slowly, through pursed lips. Whistling is an example of pursed lips. 13. Wait at least 1 minute between puffs. Continue with the above steps until you have taken the number of puffs your health care provider has ordered. Do not use the inhaler more than your health care provider directs you to. 14. Replace cap on inhaler. 15. Follow the directions from your health care provider or the inhaler insert for cleaning the inhaler. If you are using a steroid inhaler, rinse your mouth with water after your last puff, gargle, and spit out the water. Do not swallow the water. AVOID:  Inhaling before or after starting the spray of medicine. It takes practice to coordinate your breathing with triggering the spray.  Inhaling through the nose (rather than the mouth) when triggering the spray. HOW TO DETERMINE IF YOUR INHALER IS FULL OR NEARLY EMPTY You cannot know when an inhaler is empty by shaking it. A few inhalers are now being made with dose counters. Ask your health care provider for a prescription that has a dose counter if you feel you need that extra help. If your inhaler does not have a counter, ask your health care provider to help you determine the date you need to refill your inhaler. Write the refill date on a calendar or your inhaler canister.  Refill your inhaler 7 10 days before it runs out. Be sure to keep an adequate supply of medicine. This includes making sure it is not expired, and you have a spare inhaler.  SEEK MEDICAL CARE IF:   Symptoms are only partially relieved with your inhaler.  You are having trouble using your inhaler.  You experience some increase in phlegm. SEEK IMMEDIATE MEDICAL CARE IF:   You feel little or no relief with your inhalers. You are still wheezing and are feeling  shortness of breath or tightness in your chest or both.  You have dizziness, headaches, or fast heart rate.  You have chills, fever, or night sweats.  There is a noticeable increase in phlegm production, or there is blood in the phlegm. Document Released: 03/20/2007 Document Revised: 01/23/2013 Document Reviewed: 11/08/2012 [facility] Patient Information 2014 [place], [place].

## 2013-06-12 ENCOUNTER — Telehealth: Payer: Self-pay | Admitting: Family Medicine

## 2013-06-12 NOTE — Telephone Encounter (Signed)
Message copied by PETTY, Erik Obey on Wed Jun 12, 2013  8:45 AM ------      Message from: JEFFERY, CHELLE S      Created: Thu Jun 06, 2013  3:31 PM       Needs CPE; fasting labs      30 minutes ------

## 2013-06-12 NOTE — Telephone Encounter (Signed)
LMOM to CB. 

## 2013-06-13 NOTE — Telephone Encounter (Signed)
Message copied by Chinita Pester on Thu Jun 13, 2013  2:34 PM ------      Message from: JEFFERY, CHELLE S      Created: Thu Jun 06, 2013  3:31 PM       Needs CPE; fasting labs      30 minutes ------

## 2013-06-21 ENCOUNTER — Institutional Professional Consult (permissible substitution): Payer: 59 | Admitting: Neurology

## 2013-07-12 NOTE — Telephone Encounter (Signed)
CPE appt made for Feb 10.

## 2013-07-16 ENCOUNTER — Other Ambulatory Visit: Payer: Self-pay | Admitting: Physician Assistant

## 2013-07-16 ENCOUNTER — Encounter: Payer: Self-pay | Admitting: Physician Assistant

## 2013-07-16 ENCOUNTER — Ambulatory Visit (INDEPENDENT_AMBULATORY_CARE_PROVIDER_SITE_OTHER): Payer: 59 | Admitting: Physician Assistant

## 2013-07-16 VITALS — BP 148/106 | HR 80 | Temp 98.2°F | Resp 16 | Ht 67.0 in | Wt 309.6 lb

## 2013-07-16 DIAGNOSIS — F17200 Nicotine dependence, unspecified, uncomplicated: Secondary | ICD-10-CM

## 2013-07-16 DIAGNOSIS — G4700 Insomnia, unspecified: Secondary | ICD-10-CM | POA: Insufficient documentation

## 2013-07-16 DIAGNOSIS — R739 Hyperglycemia, unspecified: Secondary | ICD-10-CM

## 2013-07-16 DIAGNOSIS — Z87891 Personal history of nicotine dependence: Secondary | ICD-10-CM | POA: Insufficient documentation

## 2013-07-16 DIAGNOSIS — Z125 Encounter for screening for malignant neoplasm of prostate: Secondary | ICD-10-CM

## 2013-07-16 DIAGNOSIS — E669 Obesity, unspecified: Secondary | ICD-10-CM

## 2013-07-16 DIAGNOSIS — Z720 Tobacco use: Secondary | ICD-10-CM

## 2013-07-16 DIAGNOSIS — Z0000 Encounter for general adult medical examination without abnormal findings: Secondary | ICD-10-CM

## 2013-07-16 DIAGNOSIS — R7989 Other specified abnormal findings of blood chemistry: Secondary | ICD-10-CM

## 2013-07-16 DIAGNOSIS — E162 Hypoglycemia, unspecified: Secondary | ICD-10-CM

## 2013-07-16 DIAGNOSIS — I10 Essential (primary) hypertension: Secondary | ICD-10-CM | POA: Insufficient documentation

## 2013-07-16 DIAGNOSIS — R7309 Other abnormal glucose: Secondary | ICD-10-CM

## 2013-07-16 DIAGNOSIS — Z1211 Encounter for screening for malignant neoplasm of colon: Secondary | ICD-10-CM

## 2013-07-16 LAB — POCT URINALYSIS DIPSTICK
GLUCOSE UA: NEGATIVE
Ketones, UA: NEGATIVE
Leukocytes, UA: NEGATIVE
Nitrite, UA: NEGATIVE
Protein, UA: 30
RBC UA: NEGATIVE
SPEC GRAV UA: 1.025
UROBILINOGEN UA: 0.2
pH, UA: 6

## 2013-07-16 LAB — COMPREHENSIVE METABOLIC PANEL
ALBUMIN: 4.1 g/dL (ref 3.5–5.2)
ALT: 39 U/L (ref 0–53)
AST: 32 U/L (ref 0–37)
Alkaline Phosphatase: 62 U/L (ref 39–117)
BUN: 10 mg/dL (ref 6–23)
CHLORIDE: 100 meq/L (ref 96–112)
CO2: 29 meq/L (ref 19–32)
CREATININE: 0.8 mg/dL (ref 0.50–1.35)
Calcium: 9.3 mg/dL (ref 8.4–10.5)
Glucose, Bld: 93 mg/dL (ref 70–99)
Potassium: 4.2 mEq/L (ref 3.5–5.3)
Sodium: 139 mEq/L (ref 135–145)
Total Bilirubin: 0.5 mg/dL (ref 0.2–1.2)
Total Protein: 7 g/dL (ref 6.0–8.3)

## 2013-07-16 LAB — CBC WITH DIFFERENTIAL/PLATELET
Basophils Absolute: 0.1 10*3/uL (ref 0.0–0.1)
Basophils Relative: 1 % (ref 0–1)
EOS ABS: 0.2 10*3/uL (ref 0.0–0.7)
Eosinophils Relative: 3 % (ref 0–5)
HCT: 46.9 % (ref 39.0–52.0)
HEMOGLOBIN: 15.7 g/dL (ref 13.0–17.0)
LYMPHS ABS: 1.9 10*3/uL (ref 0.7–4.0)
Lymphocytes Relative: 30 % (ref 12–46)
MCH: 27 pg (ref 26.0–34.0)
MCHC: 33.5 g/dL (ref 30.0–36.0)
MCV: 80.6 fL (ref 78.0–100.0)
MONO ABS: 0.6 10*3/uL (ref 0.1–1.0)
MONOS PCT: 10 % (ref 3–12)
Neutro Abs: 3.5 10*3/uL (ref 1.7–7.7)
Neutrophils Relative %: 56 % (ref 43–77)
Platelets: 374 10*3/uL (ref 150–400)
RBC: 5.82 MIL/uL — AB (ref 4.22–5.81)
RDW: 15.2 % (ref 11.5–15.5)
WBC: 6.2 10*3/uL (ref 4.0–10.5)

## 2013-07-16 LAB — POCT UA - MICROSCOPIC ONLY
BACTERIA, U MICROSCOPIC: NEGATIVE
Casts, Ur, LPF, POC: NEGATIVE
Crystals, Ur, HPF, POC: NEGATIVE
Epithelial cells, urine per micros: NEGATIVE
RBC, urine, microscopic: NEGATIVE
WBC, Ur, HPF, POC: NEGATIVE
Yeast, UA: NEGATIVE

## 2013-07-16 LAB — TSH: TSH: 0.257 u[IU]/mL — ABNORMAL LOW (ref 0.350–4.500)

## 2013-07-16 LAB — LIPID PANEL
CHOL/HDL RATIO: 4.5 ratio
CHOLESTEROL: 199 mg/dL (ref 0–200)
HDL: 44 mg/dL (ref >39)
LDL Cholesterol: 119 mg/dL — ABNORMAL HIGH (ref 0–99)
Triglycerides: 181 mg/dL — ABNORMAL HIGH (ref <150)
VLDL: 36 mg/dL (ref 0–40)

## 2013-07-16 LAB — POCT GLYCOSYLATED HEMOGLOBIN (HGB A1C): HEMOGLOBIN A1C: 5.1

## 2013-07-16 LAB — IFOBT (OCCULT BLOOD): IMMUNOLOGICAL FECAL OCCULT BLOOD TEST: NEGATIVE

## 2013-07-16 LAB — GLUCOSE, POCT (MANUAL RESULT ENTRY): POC GLUCOSE: 102 mg/dL — AB (ref 70–99)

## 2013-07-16 MED ORDER — AMLODIPINE BESYLATE 5 MG PO TABS: 5.0000 mg | ORAL_TABLET | Freq: Every day | ORAL | Status: DC

## 2013-07-16 MED ORDER — LOSARTAN POTASSIUM 100 MG PO TABS: 100.0000 mg | ORAL_TABLET | Freq: Every day | ORAL | Status: DC

## 2013-07-16 NOTE — Addendum Note (Signed)
Addended by: Fara Chute on: 07/16/2013 11:25 PM   Modules accepted: Orders

## 2013-07-16 NOTE — Patient Instructions (Addendum)
I will contact you with your lab results as soon as they are available.   If you have not heard from me in 2 weeks, please contact me.  The fastest way to get your results is to register for My Chart (see the instructions on the last page of this printout).  Check your blood pressure periodically at home once you have been on the higher dose of the losartan for about 4 weeks.  If your blood pressure remains above 140/90 consistently, let me know so we can increase the amlodipine dose, too.  Keeping you healthy  Get these tests  Blood pressure- Have your blood pressure checked once a year by your healthcare provider.  Normal blood pressure is 120/80.  Weight- Have your body mass index (BMI) calculated to screen for obesity.  BMI is a measure of body fat based on height and weight. You can also calculate your own BMI at [employer].it.  Cholesterol- Have your cholesterol checked regularly starting at age 44, sooner may be necessary if you have diabetes, high blood pressure, if a family member developed heart diseases at an early age or if you smoke.   Chlamydia, HIV, and other sexual transmitted disease- Get screened each year until the age of 44 then within three months of each new sexual partner.  Diabetes- Have your blood sugar checked regularly if you have high blood pressure, high cholesterol, a family history of diabetes or if you are overweight.  Get these vaccines  Flu shot- Every fall.  Tetanus shot- Every 10 years.  Menactra- Single dose; prevents meningitis.  Take these steps  Don't smoke- If you do smoke, ask your healthcare provider about quitting. For tips on how to quit, go to www.smokefree.gov or call 1-800-QUIT-NOW.  Be physically active- Exercise 5 days a week for at least 30 minutes.  If you are not already physically active start slow and gradually work up to 30 minutes of moderate physical activity.  Examples of moderate activity include walking briskly,  mowing the yard, dancing, swimming bicycling, etc.  Eat a healthy diet- Eat a variety of healthy foods such as fruits, vegetables, low fat milk, low fat cheese, yogurt, lean meats, poultry, fish, beans, tofu, etc.  For more information on healthy eating, go to www.thenutritionsource.org  Drink alcohol in moderation- Limit alcohol intake two drinks or less a day.  Never drink and drive.  Dentist- Brush and floss teeth twice daily; visit your dentis twice a year.  Depression-Your emotional health is as important as your physical health.  If you're feeling down, losing interest in things you normally enjoy please talk with your healthcare provider.  Gun Safety- If you keep a gun in your home, keep it unloaded and with the safety lock on.  Bullets should be stored separately.  Helmet use- Always wear a helmet when riding a motorcycle, bicycle, rollerblading or skateboarding.  Safe sex- If you may be exposed to a sexually transmitted infection, use a condom  Seat belts- Seat bels can save your life; always wear one.  Smoke/Carbon Monoxide detectors- These detectors need to be installed on the appropriate level of your home.  Replace batteries at least once a year.  Skin Cancer- When out in the sun, cover up and use sunscreen SPF 15 or higher.  Violence- If anyone is threatening or hurting you, please tell your healthcare provider.

## 2013-07-16 NOTE — Progress Notes (Signed)
Subjective:    Patient ID: Patrick Russell, male    DOB: 1970/09/05, 43 y.o.   MRN: [id number]  PCP: Brazos Sandoval, PA-C  Chief Complaint  Patient presents with  . Annual Exam     Active Ambulatory Problems    Diagnosis Date Noted  . Obesity   . Hypertension 07/16/2013  . Tobacco abuse 07/16/2013  . Insomnia 07/16/2013   Resolved Ambulatory Problems    Diagnosis Date Noted  . No Resolved Ambulatory Problems   Past Medical History  Diagnosis Date  . Gout     History reviewed. No pertinent past surgical history.  No Known Allergies  Prior to Admission medications   Medication Sig Start Date End Date Taking? Authorizing Provider  amLODipine (NORVASC) 5 MG tablet Take 1 tablet (5 mg total) by mouth daily. PATIENT NEEDS OFFICE VISIT FOR ADDITIONAL REFILLS - 2nd NOTICE! 10/30/12  Yes Morrell Riddle, PA-C  Cranberry 400 MG TABS Take 400 mg by mouth every other day.   Yes Historical Provider, MD  losartan (COZAAR) 50 MG tablet Take 1 tablet (50 mg total) by mouth daily. PATIENT NEEDS OFFICE VISIT FOR ADDITIONAL REFILLS - 2nd notice! 10/30/12  Yes Morrell Riddle, PA-C  colchicine 0.6 MG tablet Take 0.6 mg by mouth as needed.    Historical Provider, MD    History   Social History  . Marital Status: Married    Spouse Name: [name]    Number of Children: 2  . Years of Education: N/A   Occupational History  . [employer]   Social History Main Topics  . Smoking status: Current Some Day Smoker -- 0.50 packs/day    Types: Cigarettes  . Smokeless tobacco: Never Used     Comment: Chantix worked  . Alcohol Use: 3.0 oz/week    6 drink(s) per week  . Drug Use: No  . Sexual Activity: Yes    Partners: Female   Other Topics Concern  . None   Social History Narrative   Lives with his wife and their daughter.  Their son is in college and lives independently.    family history includes Cancer in his father; Diabetes in his brother,  father, and mother; Hypertension in his brother, father, and mother. indicated that his mother is alive. He indicated that his father is alive. He indicated that all of his four sisters are alive. He indicated that all of his four brothers are alive. He indicated that his daughter is alive. He indicated that his son is alive.   HPI  Since his last CPE in 09/2011, he's been seen for sinusitis (11/2011) and sinusitis and bronchitis (06/2013), now completely resolved.   Wasn't able to keep the appointment for the sleep study (recommended at his visit in 06/2013 when his wife reported his snoring and waking from sleep coughing-she was recently diagnosed with OSA) due to his work schedule.  A friend of his had what sounds like an auto-pap home study and he asks if this is an option for him as well. He reports that he awakens feeling rested and denies daytime somnolence.  He's frustrated that since switching from Azor to amlodipine and losartan his BP hasn't been controlled.  He's tolerating them well, just not getting good BP readings.  Has had significantly fewer gout attacks since stopping the allopurinol.  Review of Systems  Constitutional: Negative.   HENT: Negative.   Eyes: Negative.   Respiratory: Negative.   Cardiovascular: Negative.  Gastrointestinal: Positive for abdominal pain (intermittent, RLQ; no bulging). Negative for nausea, vomiting, diarrhea, constipation, blood in stool, abdominal distention, anal bleeding and rectal pain.  Genitourinary: Negative.   Musculoskeletal: Negative.   Skin: Negative.   Neurological: Negative.   Psychiatric/Behavioral: Negative.        Objective:   Physical Exam  Constitutional: He is oriented to person, place, and time. Vital signs are normal. He appears well-developed and well-nourished. He is active and cooperative.  Non-toxic appearance. He does not have a sickly appearance. He does not appear ill. No distress.  HENT:  Head: Normocephalic and  atraumatic.  Right Ear: Hearing, tympanic membrane, external ear and ear canal normal.  Left Ear: Hearing, tympanic membrane, external ear and ear canal normal.  Nose: Nose normal.  Mouth/Throat: Uvula is midline, oropharynx is clear and moist and mucous membranes are normal. He does not have dentures. No oral lesions. No trismus in the jaw. Normal dentition. No dental abscesses, uvula swelling, lacerations or dental caries.  Eyes: Conjunctivae, EOM and lids are normal. Pupils are equal, round, and reactive to light. Right eye exhibits no discharge. Left eye exhibits no discharge. No scleral icterus.  Fundoscopic exam:      The right eye shows no arteriolar narrowing, no AV nicking, no exudate, no hemorrhage and no papilledema.       The left eye shows no arteriolar narrowing, no AV nicking, no exudate, no hemorrhage and no papilledema.  Neck: Normal range of motion, full passive range of motion without pain and phonation normal. Neck supple. No spinous process tenderness and no muscular tenderness present. No rigidity. No tracheal deviation, no edema, no erythema and normal range of motion present. No thyromegaly present.  Cardiovascular: Normal rate, regular rhythm, S1 normal, S2 normal, normal heart sounds, intact distal pulses and normal pulses.  Exam reveals no gallop and no friction rub.   No murmur heard. Pulmonary/Chest: Effort normal and breath sounds normal. No respiratory distress. He has no wheezes. He has no rales.  Abdominal: Soft. Normal appearance and bowel sounds are normal. He exhibits no distension and no mass. There is no hepatosplenomegaly. There is no tenderness. There is no rebound and no guarding. No hernia. Hernia confirmed negative in the right inguinal area and confirmed negative in the left inguinal area.    Genitourinary: Rectum normal, prostate normal and penis normal. Rectal exam shows no external hemorrhoid, no internal hemorrhoid, no fissure, no mass, no tenderness  and anal tone normal. Guaiac negative stool.    Right testis shows tenderness (tenderness of the scrotal wall-mild). Right testis shows no mass and no swelling. Right testis is descended. Cremasteric reflex is not absent on the right side. Left testis shows no mass, no swelling and no tenderness. Left testis is descended. Cremasteric reflex is not absent on the left side. Uncircumcised. No phimosis, paraphimosis, hypospadias, penile erythema or penile tenderness. No discharge found.  Musculoskeletal: Normal range of motion. He exhibits no edema and no tenderness.       Right shoulder: Normal.       Left shoulder: Normal.       Right elbow: Normal.      Left elbow: Normal.       Right wrist: Normal.       Left wrist: Normal.       Right hip: Normal.       Left hip: Normal.       Right knee: Normal.       Left knee: Normal.  Right ankle: Normal. Achilles tendon normal.       Left ankle: Normal. Achilles tendon normal.       Cervical back: Normal. He exhibits normal range of motion, no tenderness, no bony tenderness, no swelling, no edema, no deformity, no laceration, no pain, no spasm and normal pulse.       Thoracic back: Normal.       Lumbar back: Normal.       Right upper arm: Normal.       Left upper arm: Normal.       Right forearm: Normal.       Left forearm: Normal.       Right hand: Normal.       Left hand: Normal.       Right upper leg: Normal.       Left upper leg: Normal.       Right lower leg: Normal.       Left lower leg: Normal.       Right foot: Normal.       Left foot: Normal.  Lymphadenopathy:       Head (right side): No submental, no submandibular, no tonsillar, no preauricular, no posterior auricular and no occipital adenopathy present.       Head (left side): No submental, no submandibular, no tonsillar, no preauricular, no posterior auricular and no occipital adenopathy present.    He has no cervical adenopathy.       Right: No inguinal and no  supraclavicular adenopathy present.       Left: No inguinal and no supraclavicular adenopathy present.  Neurological: He is alert and oriented to person, place, and time. He has normal strength and normal reflexes. He displays no tremor. No cranial nerve deficit. He exhibits normal muscle tone. Coordination and gait normal.  Skin: Skin is warm, dry and intact. No abrasion, no ecchymosis, no laceration, no lesion and no rash noted. He is not diaphoretic. No cyanosis or erythema. No pallor. Nails show no clubbing.  Psychiatric: He has a normal mood and affect. His speech is normal and behavior is normal. Judgment and thought content normal. Cognition and memory are normal.         Assessment & Plan:  1. Routine general medical examination at a health care facility Age appropriate anticipatory guidance provided.  2. Hypertension Above goal.  Increase losartan dose to 100 mg.  Patient to monitor BP at home.  If remains >140/90 consistently in 4 weeks, will increase amlodipine dose as well.  Avoid diuretics due to gout. - CBC with Differential - Comprehensive metabolic panel - TSH - POCT UA - Microscopic Only - POCT urinalysis dipstick - amLODipine (NORVASC) 5 MG tablet; Take 1 tablet (5 mg total) by mouth daily.  Dispense: 90 tablet; Refill: 3 - losartan (COZAAR) 100 MG tablet; Take 1 tablet (100 mg total) by mouth daily.  Dispense: 90 tablet; Refill: 3  3. Obesity Encouraged healthy eating and regular exercise. - POCT glucose (manual entry) - Lipid panel  4. Tobacco abuse Patient did well previously with Chantix, but his employer-sponsored program ended and his insurance doesn't cover it.  He'll contact his pharmacy benefit plan to see if they do cover Wellbutrin/Zyban or any other products for smoking cessation, and let me know.  5. Insomnia He snores, and reportedly wakes coughing at night.  Will try to get a home test through [facility].  6. Screening for prostate cancer -  PSA  7. Screening for colon cancer -  IFOBT POC (occult bld, rslt in office)  8. Hyperglycemia - POCT glycosylated hemoglobin (Hb A1C)   Fernande Bras, PA-C Physician Assistant-Certified [facility]

## 2013-07-17 LAB — PSA: PSA: 0.89 ng/mL (ref <=4.00)

## 2013-07-17 LAB — T3, FREE: T3 FREE: 3 pg/mL (ref 2.3–4.2)

## 2013-07-17 LAB — T4, FREE: Free T4: 0.94 ng/dL (ref 0.80–1.80)

## 2013-07-17 NOTE — Addendum Note (Signed)
Addended by: Fara Chute on: 07/17/2013 07:30 PM   Modules accepted: Orders

## 2013-07-17 NOTE — Progress Notes (Signed)
Called [facility], spoke with Robin, added Free T3 and Free T4

## 2013-09-10 ENCOUNTER — Telehealth: Payer: Self-pay | Admitting: Radiology

## 2013-09-10 NOTE — Telephone Encounter (Signed)
Left message for patient regarding the order for home health sleep study. I need to document the Epworth Scale and score this so I can forward the information to [facility].

## 2013-09-10 NOTE — Telephone Encounter (Signed)
Spoke to patient epworth sleepiness score is 8. Have faxed order to [facility] so he can get the home sleep study.

## 2013-09-23 ENCOUNTER — Telehealth: Payer: Self-pay

## 2013-09-23 DIAGNOSIS — G4700 Insomnia, unspecified: Secondary | ICD-10-CM

## 2013-09-23 MED ORDER — COLCHICINE 0.6 MG PO TABS: 0.6000 mg | ORAL_TABLET | ORAL | Status: DC | PRN

## 2013-09-23 NOTE — Telephone Encounter (Signed)
PT STATES HE IS HAVING ANOTHER GOUT ATTACK AND WOULD LIKE TO HAVE SOME COLCHICINE 0.6 [facility] CALLED IN AND IS ALSO HAVING TROUBLE GETTING THE SLEEP STUDY WITH HIS INSURANCE PAYING FOR IT, THEY WANT TO CHARGE HIM $40.00. PLEASE CALL [phone]   [facility] [address]

## 2013-09-23 NOTE — Telephone Encounter (Signed)
Meds ordered this encounter  Medications  . colchicine 0.6 MG tablet    Sig: Take 1 tablet (0.6 mg total) by mouth as needed.    Dispense:  60 tablet    Refill:  0    Order Specific Question:  Supervising Provider    Answer:  DOOLITTLE, ROBERT P [[id number]]   Regarding the sleep study: I believe that he wanted to do a home test, so we ordered it through [facility].  It is very possible that the company they use to analyze the results is in [place], [place].  Please clarify this with Home Health and then notify the patient.

## 2013-09-23 NOTE — Telephone Encounter (Signed)
Patient states that he called [employer] and they told him that they cover sleep studies at 100%.  He is unsure why they are trying to charge him $60.  He said that the packet he received about the sleep study is from [place], [place] and he would like to be referred to a sleep center in [place].     Regarding patient's gout..he states that it is bothering him and he needs colchicine called into pharmacy for this.

## 2013-09-23 NOTE — Telephone Encounter (Signed)
Had to reorder sleep study- cancelled home health order. Pt needs to go to a local sleep study clinic. Pt ins only covers patient study that is done in a clinic not home study.

## 2013-09-23 NOTE — Telephone Encounter (Signed)
LMVM to CB. 

## 2013-09-30 ENCOUNTER — Institutional Professional Consult (permissible substitution): Payer: 59 | Admitting: Neurology

## 2013-10-14 ENCOUNTER — Encounter: Payer: Self-pay | Admitting: Neurology

## 2013-10-14 ENCOUNTER — Ambulatory Visit (INDEPENDENT_AMBULATORY_CARE_PROVIDER_SITE_OTHER): Payer: 59 | Admitting: Neurology

## 2013-10-14 ENCOUNTER — Encounter (INDEPENDENT_AMBULATORY_CARE_PROVIDER_SITE_OTHER): Payer: Self-pay

## 2013-10-14 VITALS — BP 160/97 | HR 66 | Temp 96.6°F | Ht 67.0 in | Wt 317.0 lb

## 2013-10-14 DIAGNOSIS — G4733 Obstructive sleep apnea (adult) (pediatric): Secondary | ICD-10-CM

## 2013-10-14 DIAGNOSIS — R0609 Other forms of dyspnea: Secondary | ICD-10-CM

## 2013-10-14 DIAGNOSIS — R0989 Other specified symptoms and signs involving the circulatory and respiratory systems: Secondary | ICD-10-CM

## 2013-10-14 DIAGNOSIS — R0683 Snoring: Secondary | ICD-10-CM

## 2013-10-14 NOTE — Patient Instructions (Signed)

## 2013-10-14 NOTE — Progress Notes (Signed)
Subjective:    Patient ID: Patrick Russell is a 43 y.o. male.  HPI    Star Age, MD, PhD [facility] [address], [address] [place], [place] [address]  Dear Patrick Russell,   I saw your patient, Patrick Russell, upon your kind request in my neurologic clinic today for initial consultation of his sleep disorder, in particular concerning for obstructive sleep apnea. The patient is unaccompanied today and missed his appointment with me on 09/30/2013. As you know, [name] is a very pleasant 43 year old right-handed gentleman with an underlying medical history of hypertension, smoking (quit in February 2015), obesity, and gout, who per wife is reported to snore and repeatedly wakes up coughing at night. He is not sure he has OSA, but does admit to snoring. He has gained weight over the last several years. He works 4 10-hour shifts. He goes to bed around 10 PM and watches TV till 11:30 PM or MN and the switches the TV off. His wife has a CPAP machine. He does not drink alcohol on a daily basis. He does not drink caffeine on a daily basis. 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 His rise time is 5:30 AM. He does not have AM HA. He feels adequately rested. He denies RLS Sx and is not known to kick in his sleep. He denies gasping for air. He wakes up on an average 1 times in the middle of the night and has to go to the bathroom 0 times on a typical night. He denies excessive daytime somnolence (EDS) and His Epworth Sleepiness Score (ESS) is 3/24 today. He has not fallen asleep while driving. The patient has not been taking a planned nap, maybe on weekends, but not every weekend.  He has been known to snore for the past few years. Snoring is reportedly moderate, and associated with choking sounds and witnessed apneas, per wife's report (who we called on the phone). There is no report of nighttime reflux, with occasional nighttime cough experienced. There is no family  history of RLS or OSA.    He denies cataplexy, sleep paralysis, hypnagogic or hypnopompic hallucinations, or sleep attacks. He does not report any vivid dreams, nightmares, dream enactments, or parasomnias, such as sleep talking or sleep walking. The patient has not had a sleep study or a home sleep test.   His Past Medical History Is Significant For: Past Medical History  Diagnosis Date  . Gout   . Hypertension   . Obesity     His Past Surgical History Is Significant For: History reviewed. No pertinent past surgical history.  His Family History Is Significant For: Family History  Problem Relation Age of Onset  . Hypertension Mother   . Diabetes Mother   . Cancer Father     prostate  . Diabetes Father   . Hypertension Father   . Diabetes Brother   . Hypertension Brother     His Social History Is Significant For: History   Social History  . Marital Status: Married    Spouse Name: Patrick Russell    Number of Children: 2  . Years of Education: N/A   Occupational History  . [employer] History Main Topics  . Smoking status: Current Some Day Smoker -- 0.50 packs/day    Types: Cigarettes  . Smokeless tobacco: Never Used     Comment: [employer] worked  . Alcohol Use: 3.0 oz/week  6 drink(s) per week  . Drug Use: No  . Sexual Activity: Yes    Partners: Female   Other Topics Concern  . None   Social History Narrative   Lives with his wife and their daughter.  Their son is in college and lives independently.    His Allergies Are:  No Known Allergies:   His Current Medications Are:  Outpatient Encounter Prescriptions as of 10/14/2013  Medication Sig  . amLODipine (NORVASC) 5 MG tablet Take 1 tablet (5 mg total) by mouth daily.  . Cholecalciferol (VITAMIN D-3) 1000 UNITS CAPS Take 1 capsule by mouth 3 (three) times daily.  . colchicine 0.6 MG tablet Take 1 tablet (0.6 mg total) by mouth as needed.  . Cranberry 400 MG TABS  Take 400 mg by mouth every other day.  . losartan (COZAAR) 100 MG tablet Take 1 tablet (100 mg total) by mouth daily.  . Multiple Vitamins-Minerals (MULTIVITAMIN WITH MINERALS) tablet Take 1 tablet by mouth daily.  :  Review of Systems:  Out of a complete 14 point review of systems, all are reviewed and negative with the exception of these symptoms as listed below:  Review of Systems  Constitutional: Positive for fatigue and unexpected weight change (gain).  HENT: Negative.   Eyes: Negative.   Respiratory: Negative.   Cardiovascular: Negative.   Gastrointestinal: Negative.   Endocrine: Negative.   Genitourinary: Negative.   Musculoskeletal: Negative.   Skin: Negative.   Allergic/Immunologic: Negative.   Neurological: Negative.   Hematological: Negative.   Psychiatric/Behavioral: Negative.     Objective:  Neurologic Exam  Physical Exam Physical Examination:   Filed Vitals:   10/14/13 1032  BP: 160/97  Pulse: 66  Temp: 96.6 F (35.9 C)    General Examination: The patient is a very pleasant 43 y.o. male in no acute distress. He appears well-developed and well-nourished and well groomed. He is obese.  HEENT: Normocephalic, atraumatic, pupils are equal, round and reactive to light and accommodation. Funduscopic exam is normal with sharp disc margins noted. Extraocular tracking is good without limitation to gaze excursion or nystagmus noted. Normal smooth pursuit is noted. Hearing is grossly intact. Tympanic membranes are clear bilaterally. Face is symmetric with normal facial animation and normal facial sensation. Speech is clear with no dysarthria noted. There is no hypophonia. There is no lip, neck/head, jaw or voice tremor. Neck is supple with full range of passive and active motion. There are no carotid bruits on auscultation. Oropharynx exam reveals: mild mouth dryness, adequate dental hygiene with several teeth missing, and marked airway crowding, due to narrow airway entry  and redundant soft palate and swollen uvula and tonsils in place. Mallampati is class III. Tongue protrudes centrally and palate elevates symmetrically. Tonsils are 2+ in size. Neck size is 21.5 inches.   Chest: Clear to auscultation without wheezing, rhonchi or crackles noted.  Heart: S1+S2+0, regular and normal without murmurs, rubs or gallops noted.   Abdomen: Soft, non-tender and non-distended with normal bowel sounds appreciated on auscultation.  Extremities: There is no pitting edema in the distal lower extremities bilaterally. Pedal pulses are intact.  Skin: Warm and dry without trophic changes noted. There are no varicose veins.  Musculoskeletal: exam reveals no obvious joint deformities, tenderness or joint swelling or erythema.   Neurologically:  Mental status: The patient is awake, alert and oriented in all 4 spheres. His immediate and remote memory, attention, language skills and fund of knowledge are appropriate. There is no evidence of aphasia,  agnosia, apraxia or anomia. Speech is clear with normal prosody and enunciation. Thought process is linear. Mood is normal and affect is normal.  Cranial nerves II - XII are as described above under HEENT exam. In addition: shoulder shrug is normal with equal shoulder height noted. Motor exam: Normal bulk, strength and tone is noted. There is no drift, tremor or rebound. Romberg is negative. Reflexes are 2+ throughout. Babinski: Toes are flexor bilaterally. Fine motor skills and coordination: intact with normal finger taps, normal hand movements, normal rapid alternating patting, normal foot taps and normal foot agility.  Cerebellar testing: No dysmetria or intention tremor on finger to nose testing. Heel to shin is unremarkable bilaterally other than difficulty d/t body habitus. There is no truncal or gait ataxia.  Sensory exam: intact to light touch, pinprick, vibration, temperature sense in the upper and lower extremities.  Gait, station  and balance: He stands easily. No veering to one side is noted. No leaning to one side is noted. Posture is age-appropriate and stance is narrow based. Gait shows normal stride length and normal pace. No problems turning are noted. He turns en bloc. Tandem walk is unremarkable. Intact toe and heel stance is noted.               Assessment and Plan:   In summary, Cadarius Nevares is a very pleasant 43 y.o.-year old male with a history and physical exam concerning for obstructive sleep apnea (OSA). His wife reports snoring and apneas. While he himself does not endorse telltale symptoms of sleep apnea, concerning findings on exam are his severe obesity, his large neck size, his chest and belly heaviness, and his tight airway.  I had a long chat with the patient about my findings and the diagnosis of OSA, its prognosis and treatment options. We talked about medical treatments, surgical interventions and non-pharmacological approaches. I explained in particular the risks and ramifications of untreated moderate to severe OSA, especially with respect to developing cardiovascular disease down the Road, including congestive heart failure, difficult to treat hypertension, cardiac arrhythmias, or stroke. Even type 2 diabetes has, in part, been linked to untreated OSA. Symptoms of untreated OSA include daytime sleepiness, memory problems, mood irritability and mood disorder such as depression and anxiety, lack of energy, as well as recurrent headaches, especially morning headaches. We talked about trying to maintain a healthy lifestyle in general, as well as the importance of weight control. I encouraged the patient to eat healthy, exercise daily and keep well hydrated, to keep a scheduled bedtime and wake time routine, to not skip any meals and eat healthy snacks in between meals. I advised the patient not to drive when feeling sleepy. I recommended the following at this time: sleep study with potential positive airway  pressure titration.  I explained the sleep test procedure to the patient and also outlined possible surgical and non-surgical treatment options of OSA, including the use of a custom-made dental device (which would require a referral to a specialist dentist or oral surgeon), upper airway surgical options, such as pillar implants, radiofrequency surgery, tongue base surgery, and UPPP (which would involve a referral to an ENT surgeon). Rarely, jaw surgery such as mandibular advancement may be considered.  I also explained the CPAP treatment option to the patient, who indicated that he would be willing to try CPAP if the need arises. I explained the importance of being compliant with PAP treatment, not only for insurance purposes but primarily to improve His symptoms, and for  the patient's long term health benefit, including to reduce His cardiovascular risks. I answered all his questions today and the patient was in agreement. I would like to see him back after the sleep study is completed and encouraged him to call with any interim questions, concerns, problems or updates.   Thank you very much for allowing me to participate in the care of this nice patient. If I can be of any further assistance to you please do not hesitate to call me at [phone].  Sincerely,   Star Age, MD, PhD

## 2013-10-22 ENCOUNTER — Ambulatory Visit: Payer: 59 | Admitting: Physician Assistant

## 2013-11-22 ENCOUNTER — Ambulatory Visit (INDEPENDENT_AMBULATORY_CARE_PROVIDER_SITE_OTHER): Payer: 59 | Admitting: Neurology

## 2013-11-22 DIAGNOSIS — R0683 Snoring: Secondary | ICD-10-CM

## 2013-11-22 DIAGNOSIS — G4733 Obstructive sleep apnea (adult) (pediatric): Secondary | ICD-10-CM

## 2013-11-22 DIAGNOSIS — G479 Sleep disorder, unspecified: Secondary | ICD-10-CM

## 2013-11-22 DIAGNOSIS — G4720 Circadian rhythm sleep disorder, unspecified type: Secondary | ICD-10-CM

## 2013-12-04 ENCOUNTER — Telehealth: Payer: Self-pay | Admitting: Neurology

## 2013-12-04 DIAGNOSIS — G4733 Obstructive sleep apnea (adult) (pediatric): Secondary | ICD-10-CM

## 2013-12-04 NOTE — Telephone Encounter (Signed)
Please call and notify the patient that the recent sleep study did confirm the diagnosis of obstructive sleep apnea, which is in the severe category, and that I recommend treatment for this in the form of CPAP. This will require a repeat sleep study for proper titration and mask fitting. Please explain to patient and arrange for a CPAP titration study. I have placed an order in the chart. Thanks, Star Age, MD, PhD [facility])

## 2013-12-05 ENCOUNTER — Encounter: Payer: Self-pay | Admitting: *Deleted

## 2013-12-05 NOTE — Telephone Encounter (Signed)
I called and left a message for the patient about his recent sleep study results. I informed the patient that the study confirmed the diagnosis of obstructive sleep apnea and which in his case is severe. Dr. Rexene Alberts recommend treatment in the form of CPAP. This will require a repeat study for proper titration and mask fitting. I will send a copy of the report to Harrison Mons, PA-C office and mail a copy to the patient.

## 2014-04-18 ENCOUNTER — Other Ambulatory Visit: Payer: Self-pay | Admitting: Physician Assistant

## 2014-04-21 NOTE — Telephone Encounter (Signed)
Chelle, you last saw pt for annual in Feb, do you want to give pt a RF w/note to RTC or deny w/out OV?

## 2014-08-11 ENCOUNTER — Other Ambulatory Visit: Payer: Self-pay | Admitting: Physician Assistant

## 2014-09-18 ENCOUNTER — Other Ambulatory Visit: Payer: Self-pay | Admitting: Physician Assistant

## 2015-03-16 ENCOUNTER — Other Ambulatory Visit: Payer: Self-pay | Admitting: Family Medicine

## 2015-03-16 ENCOUNTER — Ambulatory Visit
Admission: RE | Admit: 2015-03-16 | Discharge: 2015-03-16 | Disposition: A | Discharge status: Home / Self Care | Payer: Commercial Managed Care - HMO | Source: Ambulatory Visit | Attending: Family Medicine | Admitting: Family Medicine

## 2015-03-16 DIAGNOSIS — M25562 Pain in left knee: Secondary | ICD-10-CM

## 2015-05-02 DIAGNOSIS — R7989 Other specified abnormal findings of blood chemistry: Secondary | ICD-10-CM | POA: Insufficient documentation

## 2015-10-02 ENCOUNTER — Ambulatory Visit: Payer: Commercial Managed Care - HMO | Admitting: Skilled Nursing Facility1

## 2015-10-13 ENCOUNTER — Telehealth: Payer: Self-pay | Admitting: *Deleted

## 2015-10-13 ENCOUNTER — Encounter: Payer: Self-pay | Admitting: Physician Assistant

## 2015-10-13 ENCOUNTER — Other Ambulatory Visit: Payer: Self-pay | Admitting: Physician Assistant

## 2015-10-13 ENCOUNTER — Ambulatory Visit (INDEPENDENT_AMBULATORY_CARE_PROVIDER_SITE_OTHER): Payer: Commercial Managed Care - HMO | Admitting: Physician Assistant

## 2015-10-13 VITALS — BP 177/120 | HR 73 | Temp 97.9°F | Resp 18 | Ht 67.0 in | Wt 327.0 lb

## 2015-10-13 DIAGNOSIS — G4733 Obstructive sleep apnea (adult) (pediatric): Secondary | ICD-10-CM | POA: Insufficient documentation

## 2015-10-13 DIAGNOSIS — Z13220 Encounter for screening for lipoid disorders: Secondary | ICD-10-CM | POA: Diagnosis not present

## 2015-10-13 DIAGNOSIS — Z114 Encounter for screening for human immunodeficiency virus [HIV]: Secondary | ICD-10-CM | POA: Diagnosis not present

## 2015-10-13 DIAGNOSIS — Z0000 Encounter for general adult medical examination without abnormal findings: Secondary | ICD-10-CM

## 2015-10-13 DIAGNOSIS — R7989 Other specified abnormal findings of blood chemistry: Secondary | ICD-10-CM

## 2015-10-13 DIAGNOSIS — I10 Essential (primary) hypertension: Secondary | ICD-10-CM | POA: Diagnosis not present

## 2015-10-13 DIAGNOSIS — Z125 Encounter for screening for malignant neoplasm of prostate: Secondary | ICD-10-CM

## 2015-10-13 DIAGNOSIS — E291 Testicular hypofunction: Secondary | ICD-10-CM | POA: Diagnosis not present

## 2015-10-13 DIAGNOSIS — E669 Obesity, unspecified: Secondary | ICD-10-CM

## 2015-10-13 LAB — POCT URINALYSIS DIP (MANUAL ENTRY)
BILIRUBIN UA: NEGATIVE
Blood, UA: NEGATIVE
GLUCOSE UA: NEGATIVE
LEUKOCYTES UA: NEGATIVE
NITRITE UA: NEGATIVE
Protein Ur, POC: 30 — AB
Spec Grav, UA: 1.025
Urobilinogen, UA: 0.2
pH, UA: 5.5

## 2015-10-13 LAB — CBC WITH DIFFERENTIAL/PLATELET
BASOS ABS: 61 {cells}/uL (ref 0–200)
BASOS PCT: 1 %
EOS ABS: 122 {cells}/uL (ref 15–500)
Eosinophils Relative: 2 %
HEMATOCRIT: 48.8 % (ref 38.5–50.0)
Hemoglobin: 16.3 g/dL (ref 13.2–17.1)
LYMPHS PCT: 26 %
Lymphs Abs: 1586 cells/uL (ref 850–3900)
MCH: 27 pg (ref 27.0–33.0)
MCHC: 33.4 g/dL (ref 32.0–36.0)
MCV: 80.8 fL (ref 80.0–100.0)
MONO ABS: 610 {cells}/uL (ref 200–950)
MONOS PCT: 10 %
MPV: 9.9 fL (ref 7.5–12.5)
NEUTROS PCT: 61 %
Neutro Abs: 3721 cells/uL (ref 1500–7800)
PLATELETS: 277 10*3/uL (ref 140–400)
RBC: 6.04 MIL/uL — ABNORMAL HIGH (ref 4.20–5.80)
RDW: 14.8 % (ref 11.0–15.0)
WBC: 6.1 10*3/uL (ref 3.8–10.8)

## 2015-10-13 LAB — POC MICROSCOPIC URINALYSIS (UMFC): Mucus: ABSENT

## 2015-10-13 NOTE — Progress Notes (Signed)
Patient ID: Patrick Russell, male    DOB: Feb 13, 1971, 45 y.o.   MRN: [id number]  PCP: Akirra Lacerda, PA-C  Chief Complaint  Patient presents with  . Annual Exam    Subjective:   HPI: Presents for annual exam.  I last saw this patient in 07/2013. In the interim, he's been back to seeing his previous PCP, Dr. Clarene Duke. He has decided to return here, frustrated that his medications are not adequately controlling his BP.  He does not recall receiving a message from [facility] 12/2013 regarding severe OSA, and never scheduled the titration study. He states that he cannot go back for the night, and that he cannot sleep on his back. Also reports that he does not snore.  Just started Testosterone in January 2017 for low-T ([facility]), but has not noticed any increase in the energy level.   He reports that he stopped amlodipine and losartan due to headache. He is presently taking losartan-HCTZ 100/12.5.    Colorectal Cancer Screening: not yet indicated Prostate Cancer Screening: today Bone Density Testing: not yet indicated HIV Screening: today STI Screening: very low risk Seasonal Influenza Vaccination: did not get one this past year, had the misunderstanding that the "wrong" vaccine two years ago. Td/Tdap Vaccination: 2010. Repeat in 2020 Pneumococcal Vaccination: not yet indicated Zoster Vaccination: not yet indicated Frequency of Dental evaluation: next visit 11/2015 Frequency of Eye evaluation: scheduled later this month [facility])     Patient Active Problem List   Diagnosis Date Noted  . OSA (obstructive sleep apnea) 10/13/2015  . Hypertension 07/16/2013  . Tobacco abuse 07/16/2013  . Insomnia 07/16/2013  . Obesity     Past Medical History  Diagnosis Date  . Gout   . Hypertension   . Obesity      Prior to Admission medications   Medication Sig Start Date End Date Taking? Authorizing Provider  colchicine 0.6 MG tablet Take 1 tablet (0.6 mg total) by mouth as  needed. 09/23/13  Yes Annelise Mccoy, PA-C  losartan-hydrochlorothiazide (HYZAAR) 100-12.5 MG tablet Take 1 tablet by mouth daily.   Yes Historical Provider, MD  TESTOSTERONE CYPIONATE IM Inject 200 mg into the muscle.   Yes Historical Provider, MD    No Known Allergies  No past surgical history on file.  Family History  Problem Relation Age of Onset  . Hypertension Mother   . Diabetes Mother   . Cancer Father     prostate  . Diabetes Father   . Hypertension Father   . Diabetes Brother   . Hypertension Brother     Social History   Social History  . Marital Status: Married    Spouse Name: [name]  . Number of Children: 2  . Years of Education: N/A   Occupational History  . [employer]   Social History Main Topics  . Smoking status: Current Some Day Smoker -- 0.50 packs/day    Types: Cigarettes  . Smokeless tobacco: Never Used     Comment: [employer] worked  . Alcohol Use: 3.0 oz/week    6 drink(s) per week  . Drug Use: No  . Sexual Activity:    Partners: Female   Other Topics Concern  . None   Social History Narrative   Lives with his wife and their daughter.  Their son is in college and lives independently.       Review of Systems  Constitutional: Negative.   HENT: Negative.   Eyes:  Negative.   Respiratory: Negative.   Cardiovascular: Negative.   Gastrointestinal: Negative.   Genitourinary: Negative.   Musculoskeletal: Negative.   Skin: Negative.   Neurological: Negative.   Psychiatric/Behavioral: Negative.         Objective:  Physical Exam  Constitutional: He is oriented to person, place, and time. He appears well-developed and well-nourished. He is active and cooperative. No distress.  BP 177/120 mmHg  Pulse 73  Temp(Src) 97.9 F (36.6 C)  Resp 18  Ht 5\' 7"  (1.702 m)  Wt 327 lb (148.326 kg)  BMI 51.20 kg/m2  HENT:  Head: Normocephalic and atraumatic.  Right Ear: Hearing, tympanic membrane, external ear and  ear canal normal.  Left Ear: Hearing, tympanic membrane, external ear and ear canal normal.  Nose: Nose normal.  Mouth/Throat: Uvula is midline, oropharynx is clear and moist and mucous membranes are normal. No oral lesions. No uvula swelling.  Eyes: Conjunctivae, EOM and lids are normal. Pupils are equal, round, and reactive to light. No scleral icterus.  Fundoscopic exam:      The right eye shows no hemorrhage and no papilledema. The right eye shows red reflex.       The left eye shows no hemorrhage and no papilledema. The left eye shows red reflex.  Neck: Normal range of motion. Neck supple. No thyromegaly present.  Cardiovascular: Normal rate, regular rhythm and normal heart sounds.   Pulses:      Radial pulses are 2+ on the right side, and 2+ on the left side.  Pulmonary/Chest: Effort normal and breath sounds normal.  Abdominal: Soft. Bowel sounds are normal. He exhibits no mass. There is no hepatosplenomegaly. There is no tenderness.  Lymphadenopathy:       Head (right side): No tonsillar, no preauricular, no posterior auricular and no occipital adenopathy present.       Head (left side): No tonsillar, no preauricular, no posterior auricular and no occipital adenopathy present.    He has no cervical adenopathy.       Right: No supraclavicular adenopathy present.       Left: No supraclavicular adenopathy present.  Neurological: He is alert and oriented to person, place, and time. No sensory deficit.  Skin: Skin is warm, dry and intact. No rash noted. No cyanosis or erythema. Nails show no clubbing.  Psychiatric: He has a normal mood and affect. His speech is normal and behavior is normal.           Assessment & Plan:  1. Annual physical exam Age appropriate anticipatory guidance provided.  2. Essential hypertension Continue losartan/HCTZ. RESTART amlodipine 10 mg. I will contact [facility] and see about using autopap, since he does not think he can manage to schedule a titration  study. In addition, he is interested in alternatives that would allow him to stomach sleep. - EKG 12-Lead - CBC with Differential/Platelet - Comprehensive metabolic panel - TSH - POCT urinalysis dipstick - POCT Microscopic Urinalysis ([facility])  3. Screening for HIV (human immunodeficiency virus) - HIV antibody  4. Screening for hyperlipidemia - Lipid panel  5. Screening for prostate cancer - PSA  6. OSA (obstructive sleep apnea) See above.  7. Low testosterone Await lab results to see if his current treatment has brought the level into normal range. - Testos,Total,Free and SHBG (Male)  8. Obesity CPAP. Increase exercise and healthy eating.    Return in about 2 months (around 12/13/2015) for follow-up blood pressure.     Fernande Bras, PA-C Physician Assistant-Certified [facility]

## 2015-10-13 NOTE — Telephone Encounter (Signed)
Faxed signed medical release form to [facility]. Confirmation page received at 6:04 pm.

## 2015-10-13 NOTE — Patient Instructions (Addendum)
I will contact the neurology office about getting you set up for CPAP, possibly with auto-pap to prevent you from needing to go in for another night in the sleep lab.   Please restart the amlodipine 10 mg for your blood pressure. Ask that the nurse at the [facility] check your blood pressure a couple of times each week and send me the readings.    IF you received an x-ray today, you will receive an invoice from [facility]. Please contact [facility] at [phone] with questions or concerns regarding your invoice.   IF you received labwork today, you will receive an invoice from [facility] [employer]. Please contact [facility] at [phone] with questions or concerns regarding your invoice.   Our billing staff will not be able to assist you with questions regarding bills from these companies.  You will be contacted with the lab results as soon as they are available. The fastest way to get your results is to activate your My Chart account. Instructions are located on the last page of this paperwork. If you have not heard from [place] regarding the results in 2 weeks, please contact this office.     Keeping you healthy  Get these tests  Blood pressure- Have your blood pressure checked once a year by your healthcare provider.  Normal blood pressure is 120/80.  Weight- Have your body mass index (BMI) calculated to screen for obesity.  BMI is a measure of body fat based on height and weight. You can also calculate your own BMI at [employer].it.  Cholesterol- Have your cholesterol checked regularly starting at age 1, sooner may be necessary if you have diabetes, high blood pressure, if a family member developed heart diseases at an early age or if you smoke.   Chlamydia, HIV, and other sexual transmitted disease- Get screened each year until the age of 88 then within three months of each new sexual partner.  Diabetes- Have your blood sugar checked  regularly if you have high blood pressure, high cholesterol, a family history of diabetes or if you are overweight.  Get these vaccines  Flu shot- Every fall.  Tetanus shot- Every 10 years.  Menactra- Single dose; prevents meningitis.  Take these steps  Don't smoke- If you do smoke, ask your healthcare provider about quitting. For tips on how to quit, go to www.smokefree.gov or call 1-800-QUIT-NOW.  Be physically active- Exercise 5 days a week for at least 30 minutes.  If you are not already physically active start slow and gradually work up to 30 minutes of moderate physical activity.  Examples of moderate activity include walking briskly, mowing the yard, dancing, swimming bicycling, etc.  Eat a healthy diet- Eat a variety of healthy foods such as fruits, vegetables, low fat milk, low fat cheese, yogurt, lean meats, poultry, fish, beans, tofu, etc.  For more information on healthy eating, go to www.thenutritionsource.org  Drink alcohol in moderation- Limit alcohol intake two drinks or less a day.  Never drink and drive.  Dentist- Brush and floss teeth twice daily; visit your dentis twice a year.  Depression-Your emotional health is as important as your physical health.  If you're feeling down, losing interest in things you normally enjoy please talk with your healthcare provider.  Gun Safety- If you keep a gun in your home, keep it unloaded and with the safety lock on.  Bullets should be stored separately.  Helmet use- Always wear a helmet when riding a motorcycle, bicycle, rollerblading or skateboarding.  Safe sex- If you may be exposed to a sexually transmitted infection, use a condom  Seat belts- Seat bels can save your life; always wear one.  Smoke/Carbon Monoxide detectors- These detectors need to be installed on the appropriate level of your home.  Replace batteries at least once a year.  Skin Cancer- When out in the sun, cover up and use sunscreen SPF 15 or  higher.  Violence- If anyone is threatening or hurting you, please tell your healthcare provider.

## 2015-10-13 NOTE — Telephone Encounter (Signed)
Faxed signed medical release form to [facility]. Confirmation page received at 6:05 pm.

## 2015-10-14 LAB — LIPID PANEL
CHOL/HDL RATIO: 4.1 ratio (ref <=5.0)
CHOLESTEROL: 215 mg/dL — AB (ref 125–200)
HDL: 53 mg/dL (ref >=40)
LDL Cholesterol: 116 mg/dL (ref <130)
Triglycerides: 228 mg/dL — ABNORMAL HIGH (ref <150)
VLDL: 46 mg/dL — AB (ref <30)

## 2015-10-14 LAB — HIV ANTIBODY (ROUTINE TESTING W REFLEX): HIV 1&2 Ab, 4th Generation: NONREACTIVE

## 2015-10-14 LAB — COMPREHENSIVE METABOLIC PANEL
ALK PHOS: 63 U/L (ref 40–115)
ALT: 35 U/L (ref 9–46)
AST: 39 U/L (ref 10–40)
Albumin: 4.4 g/dL (ref 3.6–5.1)
BUN: 13 mg/dL (ref 7–25)
CALCIUM: 9.7 mg/dL (ref 8.6–10.3)
CHLORIDE: 95 mmol/L — AB (ref 98–110)
CO2: 28 mmol/L (ref 20–31)
Creat: 0.95 mg/dL (ref 0.60–1.35)
GLUCOSE: 110 mg/dL — AB (ref 65–99)
POTASSIUM: 3.7 mmol/L (ref 3.5–5.3)
Sodium: 138 mmol/L (ref 135–146)
Total Bilirubin: 1 mg/dL (ref 0.2–1.2)
Total Protein: 7.2 g/dL (ref 6.1–8.1)

## 2015-10-14 LAB — TSH: TSH: 0.26 m[IU]/L — AB (ref 0.40–4.50)

## 2015-10-14 LAB — PSA: PSA: 0.66 ng/mL (ref <=4.00)

## 2015-10-16 LAB — T3, FREE: T3 FREE: 3 pg/mL (ref 2.3–4.2)

## 2015-10-16 LAB — T4, FREE: Free T4: 0.9 ng/dL (ref 0.8–1.8)

## 2015-10-18 LAB — TESTOS,TOTAL,FREE AND SHBG (FEMALE)
Sex Hormone Binding Glob.: 29 nmol/L (ref 10–50)
TESTOSTERONE,FREE: 66.9 pg/mL (ref 35.0–155.0)
TESTOSTERONE,TOTAL,LC/MS/MS: 492 ng/dL (ref 250–1100)

## 2015-10-19 ENCOUNTER — Telehealth: Payer: Self-pay | Admitting: Physician Assistant

## 2015-10-19 DIAGNOSIS — G4733 Obstructive sleep apnea (adult) (pediatric): Secondary | ICD-10-CM

## 2015-10-19 NOTE — Telephone Encounter (Signed)
Please call this patient.  I asked Dr. Rexene Alberts if a home auto-titration was an option for this patient. She said that it is not recommended, due to the severity of his sleep apnea.  She strongly recommends that he come in to discuss with her. She also said that he does not have to sleep on his stomach, and she can discuss that with him, too.  I really believe that it's important that he do this. I'm referring him back to [place] (since he's not been there in nearly 2 years), and they will call him to schedule the appointment.

## 2015-10-19 NOTE — Telephone Encounter (Signed)
LMTRC jp/cma  

## 2015-10-21 NOTE — Telephone Encounter (Signed)
Spoke with pt, advised message. 

## 2015-10-30 ENCOUNTER — Ambulatory Visit: Payer: Commercial Managed Care - HMO | Admitting: Skilled Nursing Facility1

## 2015-11-01 ENCOUNTER — Encounter: Payer: Self-pay | Admitting: Physician Assistant

## 2015-11-01 DIAGNOSIS — M109 Gout, unspecified: Secondary | ICD-10-CM | POA: Insufficient documentation

## 2015-11-01 DIAGNOSIS — R7989 Other specified abnormal findings of blood chemistry: Secondary | ICD-10-CM

## 2015-11-09 ENCOUNTER — Institutional Professional Consult (permissible substitution): Payer: Commercial Managed Care - HMO | Admitting: Neurology

## 2015-11-10 ENCOUNTER — Telehealth: Payer: Self-pay

## 2015-11-10 DIAGNOSIS — R739 Hyperglycemia, unspecified: Secondary | ICD-10-CM

## 2015-11-10 NOTE — Telephone Encounter (Signed)
Chelle, I spoke with this pt. About his lab results he said he was fasting that day, he wanted to know if you can put in a lab only order for him to return for blood work on [employer]

## 2015-11-10 NOTE — Telephone Encounter (Signed)
Yes.  Orders are in.

## 2015-11-11 NOTE — Telephone Encounter (Signed)
LM advising pt.  ?

## 2015-11-19 ENCOUNTER — Ambulatory Visit (INDEPENDENT_AMBULATORY_CARE_PROVIDER_SITE_OTHER): Payer: Commercial Managed Care - HMO | Admitting: Neurology

## 2015-11-19 ENCOUNTER — Institutional Professional Consult (permissible substitution): Payer: Commercial Managed Care - HMO | Admitting: Neurology

## 2015-11-19 ENCOUNTER — Encounter: Payer: Self-pay | Admitting: Neurology

## 2015-11-19 VITALS — BP 140/87 | HR 91 | Resp 18 | Ht 67.0 in | Wt 324.0 lb

## 2015-11-19 DIAGNOSIS — G4733 Obstructive sleep apnea (adult) (pediatric): Secondary | ICD-10-CM | POA: Diagnosis not present

## 2015-11-19 DIAGNOSIS — G4734 Idiopathic sleep related nonobstructive alveolar hypoventilation: Secondary | ICD-10-CM | POA: Diagnosis not present

## 2015-11-19 DIAGNOSIS — G4710 Hypersomnia, unspecified: Secondary | ICD-10-CM | POA: Diagnosis not present

## 2015-11-19 NOTE — Progress Notes (Signed)
Subjective:    Patient ID: Patrick Russell is a 45 y.o. male.  HPI     Interim history:  [name] is a very pleasant 45 year old right-handed gentleman with an underlying medical history of hypertension, prior smoking (quit in February 2015), morbid obesity, and gout, who presents for follow-up consultation of his severe obstructive sleep apnea. The patient is unaccompanied today. I first met him on 10/14/2013 at the request of his primary care provider, at which time he reported snoring, and sleep disruption. I invited him back for a sleep study. He had a baseline sleep study on 11/18/2013 which showed severe obstructive sleep apnea. He never returned for follow-up.   Today, 11/19/2015: He reports difficulty maintaining sleep. His Epworth sleepiness score is 5 out of 24 today, fatigue score is 48 out of 63. He denies any morning headaches, he does not drink caffeine on a regular basis. 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 We talked about his sleep study results from 11/22/2013: Sleep efficiency was significantly reduced. Sleep onset was markedly delayed. AHI was 111 per hour, O2 nadir was 82%, he had no slow-wave sleep, he had an increased percentage of stage II sleep, little REM sleep. He says that he had a very difficult time sleeping on his back. At home he sleeps on his stomach. He believes that his sleep apnea was not accurately diagnose because he slept a little. He had taken an over-the-counter sleep aid at the time of his sleep study. He typically does not take a sleeping pill or sleep aid over-the-counter. He reports that he is working on weight loss. Compared to February 2015 his weight is actually up today. He denies nocturia.   Previously:  10/14/2013: He per wife is reported to snore and repeatedly wakes up coughing at night. He is not sure he has OSA, but does admit to snoring. He has gained weight over the last several years. He works 4 10-hour shifts. He  goes to bed around 10 PM and watches TV till 11:30 PM or MN and the switches the TV off. His wife has a CPAP machine. He does not drink alcohol on a daily basis. He does not drink caffeine on a daily basis. 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 His rise time is 5:30 AM. He does not have AM HA. He feels adequately rested. He denies RLS Sx and is not known to kick in his sleep. He denies gasping for air. He wakes up on an average 1 times in the middle of the night and has to go to the bathroom 0 times on a typical night. He denies excessive daytime somnolence (EDS) and His Epworth Sleepiness Score (ESS) is 3/24 today. He has not fallen asleep while driving. The patient has not been taking a planned nap, maybe on weekends, but not every weekend.   He has been known to snore for the past few years. Snoring is reportedly moderate, and associated with choking sounds and witnessed apneas, per wife's report (who we called on the phone). There is no report of nighttime reflux, with occasional nighttime cough experienced. There is no family history of RLS or OSA.     He denies cataplexy, sleep paralysis, hypnagogic or hypnopompic hallucinations, or sleep attacks. He does not report any vivid dreams, nightmares, dream enactments, or parasomnias, such as sleep talking or sleep walking. The patient has not had a sleep study or a home sleep test.  His Past Medical History Is Significant For: Past Medical History  Diagnosis Date  . Gout   . Hypertension   . Obesity     His Past Surgical History Is Significant For: No past surgical history on file.  His Family History Is Significant For: Family History  Problem Relation Age of Onset  . Hypertension Mother   . Diabetes Mother   . Cancer Father     prostate  . Diabetes Father   . Hypertension Father   . Diabetes Brother   . Hypertension Brother     His Social History Is Significant For: Social History   Social History  . Marital Status:  Married    Spouse Name: [name]  . Number of Children: 2  . Years of Education: college   Occupational History  . [employer] [place] [employer] [place] History Main Topics  . Smoking status: Former Smoker -- 0.50 packs/day    Types: Cigarettes  . Smokeless tobacco: Never Used     Comment: Quit 2014  . Alcohol Use: Yes  . Drug Use: No  . Sexual Activity:    Partners: Female   Other Topics Concern  . None   Social History Narrative   Lives with his wife and their daughter.  Their son is in college and lives independently.    His Allergies Are:  No Known Allergies:   His Current Medications Are:  Outpatient Encounter Prescriptions as of 11/19/2015  Medication Sig  . amLODipine (NORVASC) 10 MG tablet Take 10 mg by mouth daily.  . colchicine 0.6 MG tablet Take 1 tablet (0.6 mg total) by mouth as needed.  Marland Kitchen losartan-hydrochlorothiazide (HYZAAR) 100-12.5 MG tablet Take 1 tablet by mouth daily.  . TESTOSTERONE CYPIONATE IM Inject 200 mg into the muscle.   No facility-administered encounter medications on file as of 11/19/2015.  :  Review of Systems:  Out of a complete 14 point review of systems, all are reviewed and negative with the exception of these symptoms as listed below:   Review of Systems  Neurological:       Patient has had prior sleep study, never got CPAP machine, snoring, witnessed apnea.    Epworth Sleepiness Scale 0= would never doze 1= slight chance of dozing 2= moderate chance of dozing 3= high chance of dozing  Sitting and reading:1 Watching TV:1 Sitting inactive in a public place (ex. Theater or meeting):1 As a passenger in a car for an hour without a break:1 Lying down to rest in the afternoon:0 Sitting and talking to someone:0 Sitting quietly after lunch (no alcohol):1 In a car, while stopped in traffic:0 Total:5  Objective:  Neurologic Exam  Physical Exam Physical Examination:   Filed Vitals:   11/19/15 1533  BP: 140/87   Pulse: 91  Resp: 18   General Examination: The patient is a 45 y.o. male in no acute distress. He appears well-developed and well-nourished and well groomed. He is markedly obese.  HEENT: Normocephalic, atraumatic, pupils are equal, round and reactive to light and accommodation. Extraocular tracking is good without limitation to gaze excursion or nystagmus noted. Normal smooth pursuit is noted. Hearing is grossly intact. Face is symmetric with normal facial animation and normal facial sensation. Speech is clear with no dysarthria noted. There is no hypophonia. There is no lip, neck/head, jaw or voice tremor. Neck is supple with full range of passive and active motion. There are no carotid bruits on auscultation. Oropharynx exam reveals: mild  mouth dryness, adequate dental hygiene with several teeth missing, and marked airway crowding, unchanged.   Chest: Clear to auscultation without wheezing, rhonchi or crackles noted.  Heart: S1+S2+0, regular and normal without murmurs, rubs or gallops noted.   Abdomen: Soft, non-tender and non-distended with normal bowel sounds appreciated on auscultation.  Extremities: There is trace edema, L>R in the ankles bilaterally. Pedal pulses are intact.  Skin: Warm and dry without trophic changes noted. There are no varicose veins.  Musculoskeletal: exam reveals no obvious joint deformities, tenderness or joint swelling or erythema.   Neurologically:  Mental status: The patient is awake, alert and oriented in all 4 spheres. His immediate and remote memory, attention, language skills and fund of knowledge are appropriate. There is no evidence of aphasia, agnosia, apraxia or anomia. Speech is clear with normal prosody and enunciation. Thought process is linear. Mood is normal and affect is normal.  Cranial nerves II - XII are as described above under HEENT exam. Motor exam: Normal bulk, strength and tone is noted. There is no drift, tremor or rebound. Romberg is  negative. Reflexes are 2+ throughout. Fine motor skills and coordination: intact with normal finger taps, normal hand movements, normal rapid alternating patting, normal foot taps and normal foot agility.  Cerebellar testing: No dysmetria or intention tremor on finger to nose testing. Heel to shin is unremarkable bilaterally other than difficulty d/t body habitus. There is no truncal or gait ataxia.  Sensory exam: intact to light touch in the upper and lower extremities.  Gait, station and balance: He stands easily. No veering to one side is noted. No leaning to one side is noted. Posture is age-appropriate and stance is narrow based. Gait shows normal stride length and normal pace. No problems turning are noted. Tandem walk is slightly challenging in the beginning.          Assessment and Plan:   In summary, Dashel Goines is a very pleasant 45 year old male with an underlying medical history of hypertension, prior smoking (quit in February 2015), morbid obesity, and gout, whoPresents for re-consultation of his sleep disorder, particularly his prior diagnosis of severe obstructive sleep apnea. I first met him in May 2015 and had a baseline sleep study in June 2015, which showed evidence of severe obstructive sleep apnea, however he had poor sleep consolidation, delayed sleep onset, limited REM sleep, absence of deep sleep and was restless. He did not return for follow-up. His history and exam are in keeping with sleep apnea. Weight is a little bit higher than when I first met him in 2015, by about 7 pounds. He is advised about his test results, explained the findings in detail today. He is willing to come back for a repeat sleep study and consider treatment with positive airway pressure. I advised him in the interim to sleep on his sides her stomach if he prefers and during the sleep study we can try to have him sleep on his sides of possible. He is advised to continue to strive for weight loss. Blood pressure  looks good today, exam nonfocal. I again explained in particular the risks and ramifications of untreated moderate to severe OSA, especially with respect to developing cardiovascular disease down the Road, including congestive heart failure, difficult to treat hypertension, cardiac arrhythmias, or stroke. Even type 2 diabetes has, in part, been linked to untreated OSA. Symptoms of untreated OSA include daytime sleepiness, memory problems, mood irritability and mood disorder such as depression and anxiety, lack of energy,  as well as recurrent headaches, especially morning headaches. We talked about trying to maintain a healthy lifestyle in general, as well as the importance of weight control. I encouraged the patient to eat healthy, exercise daily and keep well hydrated, to keep a scheduled bedtime and wake time routine. I recommended the following at this time: sleep study with potential positive airway pressure titration.  I explained the sleep test procedure to the patient and explained the CPAP treatment option to the patient, who indicated that he would be willing to try CPAP if the need arises. I answered all his questions today and the patient was in agreement. I would like to see him back after the sleep study is completed and encouraged him to call with any interim questions, concerns, problems or updates.  I spent 25 minutes in total face-to-face time with the patient, more than 50% of which was spent in counseling and coordination of care, reviewing test results, reviewing medication and discussing or reviewing the diagnosis of OSA, its prognosis and treatment options.

## 2015-11-19 NOTE — Patient Instructions (Addendum)
You have severe obstructive sleep apnea, and we should bring you back in for another sleep study.   Please remember, the risks and ramifications of moderate to severe obstructive sleep apnea or OSA are: Cardiovascular disease, including congestive heart failure, stroke, difficult to control hypertension, arrhythmias, and even type 2 diabetes has been linked to untreated OSA. Sleep apnea causes disruption of sleep and sleep deprivation in most cases, which, in turn, can cause recurrent headaches, problems with memory, mood, concentration, focus, and vigilance. Most people with untreated sleep apnea report excessive daytime sleepiness, which can affect their ability to drive. Please do not drive if you feel sleepy.   I will likely see you back after your sleep study to go over the test results and where to go from there. We will call you after your sleep study to advise about the results (most likely, you will hear from Beverlee Nims, my nurse) and to set up an appointment at the time, as necessary.    Our sleep lab administrative assistant, Arrie Aran will meet with you or call you to schedule your sleep study. If you don't hear back from her by next week please feel free to call her at [phone]. This is her direct line and please leave a message with your phone number to call back if you get the voicemail box. She will call back as soon as possible.

## 2015-11-20 ENCOUNTER — Other Ambulatory Visit (INDEPENDENT_AMBULATORY_CARE_PROVIDER_SITE_OTHER): Payer: Commercial Managed Care - HMO | Admitting: Physician Assistant

## 2015-11-20 DIAGNOSIS — G4710 Hypersomnia, unspecified: Secondary | ICD-10-CM

## 2015-11-20 DIAGNOSIS — R739 Hyperglycemia, unspecified: Secondary | ICD-10-CM | POA: Diagnosis not present

## 2015-11-20 DIAGNOSIS — G4733 Obstructive sleep apnea (adult) (pediatric): Secondary | ICD-10-CM

## 2015-11-20 DIAGNOSIS — G4734 Idiopathic sleep related nonobstructive alveolar hypoventilation: Secondary | ICD-10-CM

## 2015-11-20 LAB — GLUCOSE, RANDOM: GLUCOSE: 108 mg/dL — AB (ref 65–99)

## 2015-11-20 LAB — HEMOGLOBIN A1C
Hgb A1c MFr Bld: 6.1 % — ABNORMAL HIGH (ref <5.7)
MEAN PLASMA GLUCOSE: 128 mg/dL

## 2015-11-20 NOTE — Progress Notes (Signed)
Patient presents this morning for lab only.

## 2015-11-22 ENCOUNTER — Encounter: Payer: Self-pay | Admitting: Physician Assistant

## 2015-11-22 DIAGNOSIS — E669 Obesity, unspecified: Principal | ICD-10-CM

## 2015-11-22 DIAGNOSIS — R7303 Prediabetes: Secondary | ICD-10-CM | POA: Insufficient documentation

## 2015-11-22 DIAGNOSIS — E1169 Type 2 diabetes mellitus with other specified complication: Secondary | ICD-10-CM

## 2015-12-08 ENCOUNTER — Encounter: Payer: Self-pay | Admitting: Physician Assistant

## 2015-12-15 ENCOUNTER — Encounter: Payer: Self-pay | Admitting: Physician Assistant

## 2015-12-15 ENCOUNTER — Ambulatory Visit (INDEPENDENT_AMBULATORY_CARE_PROVIDER_SITE_OTHER): Payer: Commercial Managed Care - HMO | Admitting: Physician Assistant

## 2015-12-15 VITALS — BP 146/92 | HR 76 | Temp 98.3°F | Resp 16 | Ht 67.0 in | Wt 322.6 lb

## 2015-12-15 DIAGNOSIS — E119 Type 2 diabetes mellitus without complications: Secondary | ICD-10-CM | POA: Diagnosis not present

## 2015-12-15 DIAGNOSIS — I10 Essential (primary) hypertension: Secondary | ICD-10-CM | POA: Diagnosis not present

## 2015-12-15 DIAGNOSIS — E1169 Type 2 diabetes mellitus with other specified complication: Secondary | ICD-10-CM

## 2015-12-15 DIAGNOSIS — G4733 Obstructive sleep apnea (adult) (pediatric): Secondary | ICD-10-CM | POA: Diagnosis not present

## 2015-12-15 DIAGNOSIS — E669 Obesity, unspecified: Secondary | ICD-10-CM | POA: Diagnosis not present

## 2015-12-15 DIAGNOSIS — M109 Gout, unspecified: Secondary | ICD-10-CM | POA: Diagnosis not present

## 2015-12-15 LAB — COMPREHENSIVE METABOLIC PANEL
ALBUMIN: 4.3 g/dL (ref 3.6–5.1)
ALT: 35 U/L (ref 9–46)
AST: 38 U/L (ref 10–40)
Alkaline Phosphatase: 62 U/L (ref 40–115)
BUN: 13 mg/dL (ref 7–25)
CHLORIDE: 98 mmol/L (ref 98–110)
CO2: 26 mmol/L (ref 20–31)
CREATININE: 0.81 mg/dL (ref 0.60–1.35)
Calcium: 9.5 mg/dL (ref 8.6–10.3)
Glucose, Bld: 103 mg/dL — ABNORMAL HIGH (ref 65–99)
POTASSIUM: 3.6 mmol/L (ref 3.5–5.3)
SODIUM: 137 mmol/L (ref 135–146)
Total Bilirubin: 0.7 mg/dL (ref 0.2–1.2)
Total Protein: 6.9 g/dL (ref 6.1–8.1)

## 2015-12-15 LAB — LIPID PANEL
CHOL/HDL RATIO: 3.7 ratio (ref <=5.0)
Cholesterol: 227 mg/dL — ABNORMAL HIGH (ref 125–200)
HDL: 61 mg/dL (ref >=40)
LDL CALC: 123 mg/dL (ref <130)
TRIGLYCERIDES: 215 mg/dL — AB (ref <150)
VLDL: 43 mg/dL — AB (ref <30)

## 2015-12-15 LAB — CBC WITH DIFFERENTIAL/PLATELET
BASOS ABS: 58 {cells}/uL (ref 0–200)
Basophils Relative: 1 %
EOS PCT: 3 %
Eosinophils Absolute: 174 cells/uL (ref 15–500)
HCT: 43.9 % (ref 38.5–50.0)
Hemoglobin: 14.6 g/dL (ref 13.2–17.1)
Lymphocytes Relative: 27 %
Lymphs Abs: 1566 cells/uL (ref 850–3900)
MCH: 26.6 pg — AB (ref 27.0–33.0)
MCHC: 33.3 g/dL (ref 32.0–36.0)
MCV: 80.1 fL (ref 80.0–100.0)
MONOS PCT: 9 %
MPV: 9.3 fL (ref 7.5–12.5)
Monocytes Absolute: 522 cells/uL (ref 200–950)
NEUTROS PCT: 60 %
Neutro Abs: 3480 cells/uL (ref 1500–7800)
PLATELETS: 308 10*3/uL (ref 140–400)
RBC: 5.48 MIL/uL (ref 4.20–5.80)
RDW: 15.2 % — ABNORMAL HIGH (ref 11.0–15.0)
WBC: 5.8 10*3/uL (ref 3.8–10.8)

## 2015-12-15 LAB — HEMOGLOBIN A1C
HEMOGLOBIN A1C: 6.2 % — AB (ref <5.7)
Mean Plasma Glucose: 131 mg/dL

## 2015-12-15 MED ORDER — AMLODIPINE BESYLATE 10 MG PO TABS: 10.0000 mg | ORAL_TABLET | Freq: Every day | ORAL | Status: DC

## 2015-12-15 MED ORDER — LOSARTAN POTASSIUM-HCTZ 100-12.5 MG PO TABS: 1.0000 | ORAL_TABLET | Freq: Every day | ORAL | Status: DC

## 2015-12-15 MED ORDER — COLCHICINE 0.6 MG PO TABS: 0.6000 mg | ORAL_TABLET | ORAL | Status: DC | PRN

## 2015-12-15 NOTE — Progress Notes (Signed)
Patient ID: Patrick Russell, male    DOB: 02-06-1971, 45 y.o.   MRN: [id number]  PCP: Porfirio Oar, PA-C  Subjective:   Chief Complaint  Patient presents with  . Follow-up  . Hypertension  . Medication Refill    Amlodipine 10mg , Losartan-HCTZ 100-12.5 mg    HPI Presents for evaluation of diabetes, which is a new diagnosis from his last visit.  Has been called back for CPAP titration and CPAP set up now that Dr. Frances Furbish has found that his insurance plan will cover it.  Knee pain is chronic.   Tolerating all his medications without adverse effects.    Review of Systems  Constitutional: Positive for fatigue. Negative for fever and chills.  Eyes: Negative for visual disturbance.  Respiratory: Negative for cough, chest tightness, shortness of breath and wheezing.   Cardiovascular: Negative for chest pain, palpitations and leg swelling.  Gastrointestinal: Negative for nausea, vomiting, abdominal pain, diarrhea and constipation.  Endocrine: Negative.   Genitourinary: Negative for dysuria, urgency, frequency and hematuria.  Musculoskeletal: Positive for arthralgias (knees). Negative for myalgias, back pain and neck pain.  Skin: Negative.   Neurological: Negative for dizziness, weakness, light-headedness and headaches.       Patient Active Problem List   Diagnosis Date Noted  . Diabetes mellitus type 2 in obese (HCC) 11/22/2015  . Gout 11/01/2015  . OSA (obstructive sleep apnea) 10/13/2015  . Low testosterone 05/02/2015  . Hypertension 07/16/2013  . History of tobacco use 07/16/2013  . Insomnia 07/16/2013  . Obesity      Prior to Admission medications   Medication Sig Start Date End Date Taking? Authorizing Provider  amLODipine (NORVASC) 10 MG tablet Take 10 mg by mouth daily. 08/27/15  Yes Historical Provider, MD  colchicine 0.6 MG tablet Take 1 tablet (0.6 mg total) by mouth as needed. 09/23/13  Yes Adonai Helzer, PA-C  losartan-hydrochlorothiazide (HYZAAR) 100-12.5  MG tablet Take 1 tablet by mouth daily.   Yes Historical Provider, MD  TESTOSTERONE CYPIONATE IM Inject 200 mg into the muscle. Reported on 12/15/2015    Historical Provider, MD     No Known Allergies     Objective:  Physical Exam  Constitutional: He is oriented to person, place, and time. He appears well-developed and well-nourished. He is active and cooperative. No distress.  BP 146/92 mmHg  Pulse 76  Temp(Src) 98.3 F (36.8 C) (Oral)  Resp 16  Ht 5\' 7"  (1.702 m)  Wt 322 lb 9.6 oz (146.33 kg)  BMI 50.51 kg/m2  SpO2 96%  HENT:  Head: Normocephalic and atraumatic.  Right Ear: Hearing normal.  Left Ear: Hearing normal.  Eyes: Conjunctivae are normal. No scleral icterus.  Neck: Normal range of motion. Neck supple. No thyromegaly present.  Cardiovascular: Normal rate, regular rhythm and normal heart sounds.   Pulses:      Radial pulses are 2+ on the right side, and 2+ on the left side.  Pulmonary/Chest: Effort normal and breath sounds normal.  Lymphadenopathy:       Head (right side): No tonsillar, no preauricular, no posterior auricular and no occipital adenopathy present.       Head (left side): No tonsillar, no preauricular, no posterior auricular and no occipital adenopathy present.    He has no cervical adenopathy.       Right: No supraclavicular adenopathy present.       Left: No supraclavicular adenopathy present.  Neurological: He is alert and oriented to person, place, and time. No sensory deficit.  Skin: Skin is warm, dry and intact. No rash noted. No cyanosis or erythema. Nails show no clubbing.  Psychiatric: He has a normal mood and affect. His speech is normal and behavior is normal.           Assessment & Plan:   1. Essential hypertension Above goal today. Anticipate significant improvement once he is able to start CPAP for severe OSA, which has been untreated for some time.. - losartan-hydrochlorothiazide (HYZAAR) 100-12.5 MG tablet; Take 1 tablet by mouth  daily.  Dispense: 90 tablet; Refill: 3 - amLODipine (NORVASC) 10 MG tablet; Take 1 tablet (10 mg total) by mouth daily.  Dispense: 90 tablet; Refill: 3 - CBC with Differential/Platelet - Comprehensive metabolic panel  2. Diabetes mellitus type 2 in obese [facility]) Await lab results and adjust regimen as needed. - Comprehensive metabolic panel - Hemoglobin A1c  3. OSA (obstructive sleep apnea) Proceed with CPAP titration.  4. Obesity Anticipate improvement once he is treating OSA.  - Lipid panel  5. Gout without tophus, unspecified cause, unspecified chronicity, unspecified site Stable. - colchicine 0.6 MG tablet; Take 1 tablet (0.6 mg total) by mouth as needed.  Dispense: 60 tablet; Refill: 0   Fernande Bras, PA-C Physician Assistant-Certified [facility]

## 2015-12-15 NOTE — Patient Instructions (Addendum)
Please schedule a visit with your dentist and eye specialist.     IF you received an x-ray today, you will receive an invoice from [facility]. Please contact [facility] at [phone] with questions or concerns regarding your invoice.   IF you received labwork today, you will receive an invoice from [employer]. Please contact [facility] at [phone] with questions or concerns regarding your invoice.   Our billing staff will not be able to assist you with questions regarding bills from these companies.  You will be contacted with the lab results as soon as they are available. The fastest way to get your results is to activate your My Chart account. Instructions are located on the last page of this paperwork. If you have not heard from [place] regarding the results in 2 weeks, please contact this office.

## 2016-01-21 ENCOUNTER — Ambulatory Visit (INDEPENDENT_AMBULATORY_CARE_PROVIDER_SITE_OTHER): Payer: Commercial Managed Care - HMO | Admitting: Neurology

## 2016-01-21 DIAGNOSIS — G479 Sleep disorder, unspecified: Secondary | ICD-10-CM

## 2016-01-21 DIAGNOSIS — G4733 Obstructive sleep apnea (adult) (pediatric): Secondary | ICD-10-CM

## 2016-01-21 DIAGNOSIS — G4710 Hypersomnia, unspecified: Secondary | ICD-10-CM

## 2016-01-21 DIAGNOSIS — G4720 Circadian rhythm sleep disorder, unspecified type: Secondary | ICD-10-CM

## 2016-01-27 ENCOUNTER — Telehealth: Payer: Self-pay | Admitting: Neurology

## 2016-01-27 DIAGNOSIS — G479 Sleep disorder, unspecified: Secondary | ICD-10-CM

## 2016-01-27 DIAGNOSIS — G4733 Obstructive sleep apnea (adult) (pediatric): Secondary | ICD-10-CM

## 2016-01-27 DIAGNOSIS — G4720 Circadian rhythm sleep disorder, unspecified type: Secondary | ICD-10-CM

## 2016-01-27 NOTE — Telephone Encounter (Signed)
Patient referred by Harrison Mons, PA, last seen by me on 11/19/15, diagnostic PSG on 01/21/16, ins: [employer].   Please call and notify the patient that the recent sleep study did confirm the diagnosis of moderate to severe obstructive sleep apnea (with at times severe desats, as low as 78%) and that I recommend treatment for this in the form of CPAP. This will require a repeat sleep study for proper titration and mask fitting. Please explain to patient and arrange for a CPAP titration study. I have placed an order in the chart. Thanks, and please route to [facility] for scheduling next sleep study.  Star Age, MD, PhD [facility])

## 2016-01-28 NOTE — Telephone Encounter (Signed)
LM for patient to call back for results

## 2016-01-28 NOTE — Telephone Encounter (Signed)
Pt returned call

## 2016-01-28 NOTE — Telephone Encounter (Signed)
I spoke to patient and he is aware of results and recommendations. He is willing to proceed with titration study. He states that he will need to schedule the 2nd study a couple of weeks out.

## 2016-03-10 ENCOUNTER — Ambulatory Visit (INDEPENDENT_AMBULATORY_CARE_PROVIDER_SITE_OTHER): Payer: Commercial Managed Care - HMO | Admitting: Neurology

## 2016-03-10 DIAGNOSIS — G4733 Obstructive sleep apnea (adult) (pediatric): Secondary | ICD-10-CM

## 2016-03-23 ENCOUNTER — Telehealth: Payer: Self-pay | Admitting: Neurology

## 2016-03-23 DIAGNOSIS — G4733 Obstructive sleep apnea (adult) (pediatric): Secondary | ICD-10-CM

## 2016-03-23 NOTE — Telephone Encounter (Signed)
Patient re-referred by Patrick Russell for OSA, last seen 11/19/15, PSG on 01/21/16, CPAP study on 03/10/16:  Please call and inform patient that I have entered an order for treatment with positive airway pressure (PAP) treatment of obstructive sleep apnea (OSA). He did well during the latest sleep study with CPAP. We will, therefore, arrange for a machine for home use through a DME (durable medical equipment) company of His choice; and I will see the patient back in follow-up in about 8-10 weeks. Please also explain to the patient that I will be looking out for compliance data, which can be downloaded from the machine (stored on an SD card, that is inserted in the machine) or via remote access through a modem, that is built into the machine. At the time of the followup appointment we will discuss sleep study results and how it is going with PAP treatment at home. Please advise patient to bring His machine at the time of the first FU visit, even though this is cumbersome. Bringing the machine for every visit after that will likely not be needed, but often helps for the first visit to troubleshoot if needed. Please re-enforce the importance of compliance with treatment and the need for [place] to monitor compliance data - often an insurance requirement and actually good feedback for the patient as far as how they are doing.  Also remind patient, that any interim PAP machine or mask issues should be first addressed with the DME company, as they can often help better with technical and mask fit issues. Please ask if patient has a preference regarding DME company.  Please also make sure, the patient has a follow-up appointment with me in about 8-10 weeks from the setup date, thanks.  Once you have spoken to the patient - and faxed/routed report to PCP and referring MD (if other than PCP), you can close this encounter, thanks,   Star Age, MD, PhD [facility] ([place])

## 2016-03-23 NOTE — Progress Notes (Signed)
PATIENT'S NAME:  Patrick Russell, Patrick Russell DOB:      Oct 26, 1970      MR#:    [id number]     DATE OF RECORDING: 03/10/2016 REFERRING M.D.:  Porfirio Oar MD Study Performed:   CPAP  Titration HISTORY: 45 year old right-handed gentleman with an underlying medical history of hypertension, prior smoking (quit in February 2015), morbid obesity, and gout, who presents for full night CPAP titration to treat his severe OSA. Baseline sleep study from 01/21/16 showed an AHI of 18.9/hour and O2 nadir of 78%. The patient endorsed the Epworth Sleepiness Scale at 5 points. The patient's weight 324 pounds with a height of 67 (inches), resulting in a BMI of 50.9 kg/m2. The patient's neck circumference measured 21 inches.  CURRENT MEDICATIONS: Norvasc, Losartan, Testosterone    PROCEDURE:  This is a multichannel digital polysomnogram utilizing the SomnoStar 11.2 system.  Electrodes and sensors were applied and monitored per AASM Specifications.   EEG, EOG, Chin and Limb EMG, were sampled at 200 Hz.  ECG, Snore and Nasal Pressure, Thermal Airflow, Respiratory Effort, CPAP Flow and Pressure, Oximetry was sampled at 50 Hz. Digital video and audio were recorded.       CPAP was initiated at 5 cmH20 with heated humidity per AASM split night standards and pressure was advanced to 11 cmH20 because of hypopneas, apneas and desaturations.  At a PAP pressure of 11 cmH20, there was a reduction of the AHI to 0 per hour with O2 nadir of 90%.   Lights Out was at 22:47 and Lights On at 05:09. Total recording time (TRT) was 382.5 minutes, with a total sleep time (TST) of 296 minutes. The patient's sleep latency was 61 minutes with 30.5 minutes of wake time after sleep onset. REM latency was 49 minutes.  The sleep efficiency was 77.4 %.    SLEEP ARCHITECTURE: WASO (wake after sleep onset) was 30.5 min, with mild sleep fragmentation noted. Stage N1 7.4%, Stage N2 34.5%, Stage N3 28.4%, which is increased, and Stage R (REM sleep) 29.7%, which is  increased.   RESPIRATORY ANALYSIS:  There was a total of 15 respiratory events: 0 obstructive apneas, 0 central apneas and 0 mixed apneas with a total of 0 apneas and an apnea index (AI) of 0. There were 15 hypopneas with a hypopnea index of 3.. The patient also had 0 respiratory event related arousals (RERAs).      The total APNEA/HYPOPNEA INDEX  (AHI) was 3. and the total RESPIRATORY DISTURBANCE INDEX was 3..  14 events occurred in REM sleep and 1 events in NREM. The REM AHI was 9.5, versus a non-REM AHI of .3. The patient spent 10% of total sleep time in the supine position. The supine AHI was 1.9, versus a non-supine AHI of 3.2.  OXYGEN SATURATION & C02:  The baseline 02 saturation was 94%, with the lowest being 82%. Time spent below 89% saturation equaled 7 minutes.   PERIODIC LIMB MOVEMENTS: (Baseline)   The patient had a total of 0 Periodic Limb Movements. The Periodic Limb Movement (PLM) index was 0 and the PLM Arousal index was 0  The Technologist noted the patient was fitted with a Resmed AirFit P10 Pillows size Medium apparatus.  DIAGNOSIS 1.  Obstructive Sleep Apnea   PLANS/RECOMMENDATIONS: 1. This study demonstrates resolution of the patient's obstructive sleep apnea with CPAP therapy. I will, therefore, start the patient on home CPAP treatment at a pressure of 11 cm via medium nasal pillows with heated humidity. The patient should be  reminded to be fully compliant with PAP therapy to improve sleep related symptoms and decrease long term cardiovascular risks. The patient should be reminded, that it may take up to 3 months to get fully used to using PAP with all planned sleep. The earlier full compliance is achieved, the better long term compliance tends to be. Please note that untreated obstructive sleep apnea carries additional perioperative morbidity. Patients with significant obstructive sleep apnea should receive perioperative PAP therapy and the surgeons and particularly the  anesthesiologist should be informed of the diagnosis and the severity of the sleep disordered breathing. 2. Weight loss should be encouraged.  3. Generally speaking, alternative OSA treatments may include avoidance of supine sleep position along with weight loss, upper airway or jaw surgery in selected patients or the use of an oral appliance in certain patients. ENT evaluation and/or consultation with a maxillofacial surgeon or dentist may be feasible and some instances. 4. The patient should be cautioned not to drive, work at heights, or operate dangerous or heavy equipment when tired or sleepy. Review and reiteration of good sleep hygiene measures should be pursued with any patient. 5. A follow up appointment will be scheduled in the Sleep Clinic at [facility]. The referring provider will be notified of the test results.    I certify that I have reviewed the entire raw data recording prior to the issuance of this report in accordance with the Standards of Accreditation of the American Academy of Sleep Medicine (AASM)       Huston Foley, MD, PhD Diplomat, American Board of Psychiatry and Neurology  Diplomat, American Board of Sleep Medicine

## 2016-03-23 NOTE — Telephone Encounter (Signed)
I spoke to patient and he is aware of results and recommendations. He is willing to proceed with treatment. I will send orders to [facility]. Patient will receive a letter reminding him to make f/u appt and stress the importance of compliance. I will send report to PCP.

## 2016-04-04 ENCOUNTER — Telehealth: Payer: Self-pay

## 2016-04-04 NOTE — Telephone Encounter (Signed)
This is the email I received from Ransom at [facility],  Mr. Elley has requested to be set up on his Cpap after January so that all Cpap rentals will fall under his same deductible year.  I have Tasked myself to reach back out to him the first of January.  I wanted to be sure to keep you updated.  Please let me know if there is anything I can do for you.  I hope you had a good weekend.  Thank you :)   Thank you so much!  Vivia Budge, CSR [facility] [address] [place], [place] [address]

## 2016-04-05 ENCOUNTER — Encounter: Payer: Self-pay | Admitting: Physician Assistant

## 2016-04-05 ENCOUNTER — Ambulatory Visit (INDEPENDENT_AMBULATORY_CARE_PROVIDER_SITE_OTHER): Payer: Commercial Managed Care - HMO | Admitting: Physician Assistant

## 2016-04-05 VITALS — BP 142/90 | HR 87 | Temp 98.2°F | Resp 20 | Ht 67.0 in | Wt 324.2 lb

## 2016-04-05 DIAGNOSIS — Z23 Encounter for immunization: Secondary | ICD-10-CM | POA: Diagnosis not present

## 2016-04-05 DIAGNOSIS — L03313 Cellulitis of chest wall: Secondary | ICD-10-CM

## 2016-04-05 DIAGNOSIS — E669 Obesity, unspecified: Secondary | ICD-10-CM

## 2016-04-05 DIAGNOSIS — E1169 Type 2 diabetes mellitus with other specified complication: Secondary | ICD-10-CM | POA: Diagnosis not present

## 2016-04-05 DIAGNOSIS — I10 Essential (primary) hypertension: Secondary | ICD-10-CM

## 2016-04-05 LAB — CBC WITH DIFFERENTIAL/PLATELET
BASOS PCT: 0 %
Basophils Absolute: 0 cells/uL (ref 0–200)
EOS ABS: 152 {cells}/uL (ref 15–500)
Eosinophils Relative: 2 %
HEMATOCRIT: 42.9 % (ref 38.5–50.0)
Hemoglobin: 14.1 g/dL (ref 13.2–17.1)
LYMPHS PCT: 19 %
Lymphs Abs: 1444 cells/uL (ref 850–3900)
MCH: 27 pg (ref 27.0–33.0)
MCHC: 32.9 g/dL (ref 32.0–36.0)
MCV: 82.2 fL (ref 80.0–100.0)
MONO ABS: 760 {cells}/uL (ref 200–950)
MPV: 9.4 fL (ref 7.5–12.5)
Monocytes Relative: 10 %
Neutro Abs: 5244 cells/uL (ref 1500–7800)
Neutrophils Relative %: 69 %
Platelets: 307 10*3/uL (ref 140–400)
RBC: 5.22 MIL/uL (ref 4.20–5.80)
RDW: 15.1 % — AB (ref 11.0–15.0)
WBC: 7.6 10*3/uL (ref 3.8–10.8)

## 2016-04-05 LAB — COMPREHENSIVE METABOLIC PANEL
ALT: 27 U/L (ref 9–46)
AST: 27 U/L (ref 10–40)
Albumin: 4.3 g/dL (ref 3.6–5.1)
Alkaline Phosphatase: 55 U/L (ref 40–115)
BUN: 12 mg/dL (ref 7–25)
CHLORIDE: 100 mmol/L (ref 98–110)
CO2: 30 mmol/L (ref 20–31)
CREATININE: 0.77 mg/dL (ref 0.60–1.35)
Calcium: 10 mg/dL (ref 8.6–10.3)
GLUCOSE: 104 mg/dL — AB (ref 65–99)
Potassium: 3.9 mmol/L (ref 3.5–5.3)
SODIUM: 139 mmol/L (ref 135–146)
Total Bilirubin: 0.5 mg/dL (ref 0.2–1.2)
Total Protein: 6.8 g/dL (ref 6.1–8.1)

## 2016-04-05 LAB — HEMOGLOBIN A1C
HEMOGLOBIN A1C: 5.7 % — AB (ref <5.7)
Mean Plasma Glucose: 117 mg/dL

## 2016-04-05 LAB — LIPID PANEL
CHOL/HDL RATIO: 3.4 ratio (ref <=5.0)
Cholesterol: 205 mg/dL — ABNORMAL HIGH (ref 125–200)
HDL: 60 mg/dL (ref >=40)
LDL CALC: 118 mg/dL (ref <130)
Triglycerides: 136 mg/dL (ref <150)
VLDL: 27 mg/dL (ref <30)

## 2016-04-05 MED ORDER — LOSARTAN POTASSIUM-HCTZ 100-25 MG PO TABS: 1.0000 | ORAL_TABLET | Freq: Every day | ORAL | 3 refills | Status: DC

## 2016-04-05 MED ORDER — DOXYCYCLINE HYCLATE 100 MG PO CAPS: 100.0000 mg | ORAL_CAPSULE | Freq: Two times a day (BID) | ORAL | 0 refills | Status: DC

## 2016-04-05 NOTE — Progress Notes (Signed)
Subjective:    Patient ID: Patrick Russell, male    DOB: 26-May-1971, 45 y.o.   MRN: [id number]  HPI: Presents for an abscess on his right chest which he first noticed as a small bump months ago. States he noticed it getting larger and painful with swelling and redness about 1.5 weeks ago. Notes progressively increased swelling, erythema, and pain since then. States 4 days ago after applying pressure to the site while in the shower he was able to express some green/yellow purulent, odorous, and bloody drainage from the site. States it has continued to have a little bit a drainage from the area occasionally if he applies pressure. Notes some associated dizziness and feeling "woozy" with the pain. Denies nausea, vomiting, fever, chills, or other systemic symptoms.   History of hypertension which is currently well-controlled on Norvasc and Losartan-HCTZ. Taking medications as prescribed and denies chest pain, vision changes, headaches, SOB. Denies medication side effects. States he takes his blood pressure at home regularly and his usual measurement is around 124/82 mmHg. States he sees an eye doctor regularly, last visit 7 months ago.   Denies history of diabetes, states the last time they checked it here it was "negative".   History of gout but no recent flares. Takes Colchicine when flares occur.  Review of Systems  Constitutional: Negative for chills and fever.  HENT: Negative for congestion, ear pain, rhinorrhea, sinus pressure, sore throat and tinnitus.   Eyes: Negative for pain, discharge, redness and itching.  Respiratory: Negative for chest tightness, shortness of breath and wheezing.   Gastrointestinal: Negative for abdominal pain, blood in stool, diarrhea, nausea and vomiting.  Genitourinary: Negative for dysuria, frequency, hematuria and urgency.  Neurological: Negative for weakness, numbness and headaches.   No Known Allergies  Prior to Admission medications   Medication Sig Start  Date End Date Taking? Authorizing Provider  amLODipine (NORVASC) 10 MG tablet Take 1 tablet (10 mg total) by mouth daily. 12/15/15  Yes [name], PA-C  colchicine 0.6 MG tablet Take 1 tablet (0.6 mg total) by mouth as needed. 12/15/15  Yes [name], PA-C  losartan-hydrochlorothiazide (HYZAAR) 100-12.5 MG tablet Take 1 tablet by mouth daily. 12/15/15  Yes [name], PA-C  doxycycline (VIBRAMYCIN) 100 MG capsule Take 1 capsule (100 mg total) by mouth 2 (two) times daily. 04/05/16   Porfirio Oar, PA-C   Patient Active Problem List   Diagnosis Date Noted  . Diabetes mellitus type 2 in obese (HCC) 11/22/2015  . Gout 11/01/2015  . OSA (obstructive sleep apnea) 10/13/2015  . Low testosterone 05/02/2015  . Hypertension 07/16/2013  . History of tobacco use 07/16/2013  . Insomnia 07/16/2013  . Obesity        Objective: Blood pressure (!) 142/90, pulse 87, temperature 98.2 F (36.8 C), temperature source Oral, resp. rate 20, height 5\' 7"  (1.702 m), weight (!) 324 lb 3.2 oz (147.1 kg), SpO2 98 %.   Physical Exam  Constitutional: He is oriented to person, place, and time. He appears well-developed and well-nourished. No distress.  HENT:  Head: Normocephalic and atraumatic.  Eyes: Conjunctivae are normal. Right eye exhibits no discharge. Left eye exhibits no discharge. No scleral icterus.  Neurological: He is alert and oriented to person, place, and time.  Skin: Skin is warm and dry. No rash noted. He is not diaphoretic. There is erythema. No pallor.     Psychiatric: He has a normal mood and affect. His behavior is normal.     ABSCESS INCISION  AND DRAINAGE: Verbal consent obtained. Local anesthesia with 5cc of plain Lidocaine.  Sterile prep and drape. Incision using a 15 blade, moderate purulent and bloody drainage expressed from site with curved hemostats and scissors. Packed with 1/4 inch packing. Cleansed and dressed.     Assessment & Plan:  1. Cellulitis of chest  wall I&D in clinic with moderate purulent drainage and immediate relief. Wound culture collected and results pending. Dicussed wound care and advised patient to return in 2 days to have packing removed and wound dressed again. Recommended warm compresses daily. Instructed to take Doxycycline 100 mg PO BID for 10 days with food. Instructed RTC if symptoms worsening or not improving with medication.  - WOUND CULTURE - doxycycline (VIBRAMYCIN) 100 MG capsule; Take 1 capsule (100 mg total) by mouth 2 (two) times daily.  Dispense: 20 capsule; Refill: 0  2. Diabetes mellitus type 2 in obese [facility]) Currently not on medication. High risk patient warrants re-evaluation of CMP, HgbA1c, and lipid panel. Will re-evaluate medication upon review of lab results. - Hemoglobin A1c - Lipid panel - Comprehensive metabolic panel  3. Essential hypertension Currently taking Losartan-HCTZ and Norvasc with elevated blood pressure today of 142/90 mmHg. Will increase Losartan-HCTZ to 100-25 mg tablet PO daliy.  - CBC with Differential/Platelet - Comprehensive metabolic panel - losartan-hydrochlorothiazide (HYZAAR) 100-25 MG tablet; Take 1 tablet by mouth daily.  Dispense: 90 tablet; Refill: 3  4. Need for influenza vaccination - Flu Vaccine QUAD 36+ mos IM  5. Need for pneumococcal vaccination - Pneumococcal polysaccharide vaccine 23-valent greater than or equal to 2yo subcutaneous/IM

## 2016-04-05 NOTE — Patient Instructions (Addendum)
Take the antibiotic as prescribed. Apply a warm compress to the area for 15-20 minutes 2-4 times each day. Change the dressing if it becomes saturated, leaks or falls off.     IF you received an x-ray today, you will receive an invoice from [facility]. Please contact [facility] at [phone] with questions or concerns regarding your invoice.   IF you received labwork today, you will receive an invoice from [employer]. Please contact [facility] at [phone] with questions or concerns regarding your invoice.   Our billing staff will not be able to assist you with questions regarding bills from these companies.  You will be contacted with the lab results as soon as they are available. The fastest way to get your results is to activate your My Chart account. Instructions are located on the last page of this paperwork. If you have not heard from [place] regarding the results in 2 weeks, please contact this office.

## 2016-04-05 NOTE — Progress Notes (Signed)
Patient ID: Patrick Russell, male    DOB: 14-Jul-1970, 45 y.o.   MRN: [id number]  PCP: Porfirio Oar, PA-C  Subjective:   Chief Complaint  Patient presents with  . Chest Abcess    on right side    HPI Presents for evaluation of a bump on the RIGHT chest.  There has been a lump there for 4-6 months, but about 10 days ago it became tender, red and larger in size. Malodorous material has come out of it x 4 days. No fever, chills, nausea, but sometimes feels "woozy" from the pain of it.  Has not yet started CPAP. [facility] is working on getting his insurance to cover it.  He is also due for evaluation of diabetes, HTN and lipids. I last saw him in July. Interestingly, he denies having diabetes, reporting that his last lab tests were "negative." No CP, SOB, HA. Tolerating his medications without difficulty. Home BP readings are 120's/80's by report.  Review of Systems As above.    Patient Active Problem List   Diagnosis Date Noted  . Diabetes mellitus type 2 in obese (HCC) 11/22/2015  . Gout 11/01/2015  . OSA (obstructive sleep apnea) 10/13/2015  . Low testosterone 05/02/2015  . Hypertension 07/16/2013  . History of tobacco use 07/16/2013  . Insomnia 07/16/2013  . Obesity      Prior to Admission medications   Medication Sig Start Date End Date Taking? Authorizing Provider  amLODipine (NORVASC) 10 MG tablet Take 1 tablet (10 mg total) by mouth daily. 12/15/15  Yes Paisyn Guercio, PA-C  colchicine 0.6 MG tablet Take 1 tablet (0.6 mg total) by mouth as needed. 12/15/15  Yes Khalik Pewitt, PA-C  losartan-hydrochlorothiazide (HYZAAR) 100-12.5 MG tablet Take 1 tablet by mouth daily. 12/15/15  Yes Shiza Thelen, PA-C     No Known Allergies     Objective:  Physical Exam  Constitutional: He is oriented to person, place, and time. He appears well-developed and well-nourished. He is active and cooperative. No distress.  BP (!) 142/90   Pulse 87   Temp 98.2 F (36.8 C) (Oral)   Resp  20   Ht 5\' 7"  (1.702 m)   Wt (!) 324 lb 3.2 oz (147.1 kg)   SpO2 98%   BMI 50.78 kg/m   HENT:  Head: Normocephalic and atraumatic.  Right Ear: Hearing normal.  Left Ear: Hearing normal.  Eyes: Conjunctivae are normal. No scleral icterus.  Neck: Normal range of motion. Neck supple. No thyromegaly present.  Cardiovascular: Normal rate, regular rhythm and normal heart sounds.   Pulses:      Radial pulses are 2+ on the right side, and 2+ on the left side.  Pulmonary/Chest: Effort normal and breath sounds normal.  Lymphadenopathy:       Head (right side): No tonsillar, no preauricular, no posterior auricular and no occipital adenopathy present.       Head (left side): No tonsillar, no preauricular, no posterior auricular and no occipital adenopathy present.    He has no cervical adenopathy.       Right: No supraclavicular adenopathy present.       Left: No supraclavicular adenopathy present.  Neurological: He is alert and oriented to person, place, and time. No sensory deficit.  Skin: Skin is warm, dry and intact. Lesion noted. No rash noted. There is erythema. No cyanosis. Nails show no clubbing.     Psychiatric: He has a normal mood and affect. His speech is normal and behavior is normal.  Wt Readings from Last 3 Encounters:  04/05/16 (!) 324 lb 3.2 oz (147.1 kg)  12/15/15 (!) 322 lb 9.6 oz (146.3 kg)  11/19/15 (!) 324 lb (147 kg)    PROCEDURE: Verbal Consent Obtained. Local anesthesia with 2 cc of 2% lidocaine plain.  15 blade used to incise the lesion centrally.  Copious malodorous thick green purulence expressed. Sebaceous material, cyst sac pieces and blood with small clots expressed. Irrigated wound with 3 cc of 2% lidocaine and packed with 1/4 inch plain packing.  Cleansed and dressed.     Assessment & Plan:   1. Cellulitis of chest wall Local wound care. Doxycycline. Await wound Cx. RTC for wound care in 48 hours - WOUND CULTURE - doxycycline (VIBRAMYCIN) 100 MG  capsule; Take 1 capsule (100 mg total) by mouth 2 (two) times daily.  Dispense: 20 capsule; Refill: 0  2. Diabetes mellitus type 2 in obese [facility]) Has been well controlled. Await labs. Adjust regimen as indicated by results. - Hemoglobin A1c - Lipid panel - Comprehensive metabolic panel  3. Essential hypertension Not well controlled. INCREASE HCTZ component to 25 mg. Anticipate improvement once he can start CPAP, and will lower dose accordingly. - CBC with Differential/Platelet - Comprehensive metabolic panel - losartan-hydrochlorothiazide (HYZAAR) 100-25 MG tablet; Take 1 tablet by mouth daily.  Dispense: 90 tablet; Refill: 3  4. Need for influenza vaccination - Flu Vaccine QUAD 36+ mos IM  5. Need for pneumococcal vaccination Booster and Prevnar-13 at age 65 years - Pneumococcal polysaccharide vaccine 23-valent greater than or equal to 2yo subcutaneous/IM   Fernande Bras, PA-C Physician Assistant-Certified [facility]

## 2016-04-07 ENCOUNTER — Ambulatory Visit (INDEPENDENT_AMBULATORY_CARE_PROVIDER_SITE_OTHER): Payer: Commercial Managed Care - HMO | Admitting: Physician Assistant

## 2016-04-07 VITALS — BP 164/100 | HR 73 | Temp 98.5°F | Resp 20 | Ht 68.0 in | Wt 322.4 lb

## 2016-04-07 DIAGNOSIS — L0291 Cutaneous abscess, unspecified: Secondary | ICD-10-CM

## 2016-04-07 MED ORDER — TRAMADOL HCL 50 MG PO TABS: 50.0000 mg | ORAL_TABLET | Freq: Three times a day (TID) | ORAL | 0 refills | Status: DC | PRN

## 2016-04-07 NOTE — Progress Notes (Signed)
MRN: [id number] DOB: [birth year]  Subjective:   Patrick Russell is a 45 y.o. male presenting for follow up on I&D of right breast. He was here two days ago for a lump on breast x 4-6 months, which recently started draining foul smelling liquid.   [name] incised, drained and packed it. He was started on doxycycline, however he has not been taking it as he never filled the prescription. He has changed his dressing and reports no problem with the wound or with the packing.  He states he is in a lot of pain, as it woke him up this morning at 3 am.  Denies fever, chills, nausea, vomiting.   [place] has a current medication list which includes the following prescription(s): amlodipine, colchicine, losartan-hydrochlorothiazide, and doxycycline. Also has No Known Allergies.  [name]  has a past medical history of Gout; Hypertension; Obesity; Subclinical hyperthyroidism; Tinea pedis; and Tobacco use. Also  has no past surgical history on file.  Objective:   Vitals: BP (!) 164/100 (BP Location: Right Arm, Patient Position: Sitting, Cuff Size: Large)   Pulse 73   Temp 98.5 F (36.9 C) (Oral)   Resp 20   Ht 5\' 8"  (1.727 m)   Wt (!) 322 lb 6.4 oz (146.2 kg)   SpO2 98%   BMI 49.02 kg/m   Physical Exam  Constitutional: He is oriented to person, place, and time. He appears well-developed and well-nourished.  Pulmonary/Chest: Effort normal. No respiratory distress.  Neurological: He is alert and oriented to person, place, and time.  Skin: Skin is warm and dry.  1 cm incision inferior right breast. 4-5 cm area of induration present. Minimal erythema. Packing still present. TTP.   Psychiatric: He has a normal mood and affect. His behavior is normal. Judgment and thought content normal.  Vitals reviewed.   No results found for this or any previous visit (from the past 24 hour(s)).  Procedure: Verbal consent obtained. Upon removal of packing, a copious amount of purulent drainage and blood  was expressed from wound. Wound irrigated with 5 cc Lidocaine. Wound was lightly repacked with 1/4 inch packing. Wound dressed and wound care discussed.   Assessment and Plan :  1. Abscess - traMADol (ULTRAM) 50 MG tablet; Take 1 tablet (50 mg total) by mouth every 8 (eight) hours as needed.  Dispense: 12 tablet; Refill: 0 - Patient is to RTC in 48 hours for wound check. Wound care discussed. He understands and agrees with plan.   Marco Collie, PA-C  [facility] 04/07/2016 1:04 PM

## 2016-04-07 NOTE — Patient Instructions (Addendum)
Please fill your prescription of doxycycline (VIBRAMYCIN) and take as directed.   Change dressing if dressing is soiled. Keep covered at all times except when showering. You may get wet in the shower but do not scrub.  Apply warm compresses/heating pad to facilitate drainage. Leave packing in place. Do not apply any ointments as this may delay healing. If an antibiotic was prescribed, take as directed until finished. Return in 48 for wound care.   Thank you for coming in today. I hope you feel we met your needs.  Feel free to call [facility] if you have any questions or further requests.  Please consider signing up for MyChart if you do not already have it, as this is a great way to communicate with me.  Best,  Whitney McVey, PA-C    IF you received an x-ray today, you will receive an invoice from [facility]. Please contact [facility] at [phone] with questions or concerns regarding your invoice.   IF you received labwork today, you will receive an invoice from [employer]. Please contact [facility] at [phone] with questions or concerns regarding your invoice.   Our billing staff will not be able to assist you with questions regarding bills from these companies.  You will be contacted with the lab results as soon as they are available. The fastest way to get your results is to activate your My Chart account. Instructions are located on the last page of this paperwork. If you have not heard from [place] regarding the results in 2 weeks, please contact this office.

## 2016-04-08 LAB — WOUND CULTURE
GRAM STAIN: NONE SEEN
Gram Stain: NONE SEEN
Organism ID, Bacteria: NO GROWTH

## 2016-04-09 ENCOUNTER — Ambulatory Visit (INDEPENDENT_AMBULATORY_CARE_PROVIDER_SITE_OTHER): Payer: Commercial Managed Care - HMO | Admitting: Physician Assistant

## 2016-04-09 VITALS — BP 134/82 | HR 75 | Temp 98.3°F | Resp 18 | Ht 68.0 in

## 2016-04-09 DIAGNOSIS — Z5189 Encounter for other specified aftercare: Secondary | ICD-10-CM

## 2016-04-09 NOTE — Patient Instructions (Signed)
     IF you received an x-ray today, you will receive an invoice from [facility]. Please contact [facility] at [phone] with questions or concerns regarding your invoice.   IF you received labwork today, you will receive an invoice from [facility]. Please contact [facility] at [phone] with questions or concerns regarding your invoice.   Our billing staff will not be able to assist you with questions regarding bills from these companies.  You will be contacted with the lab results as soon as they are available. The fastest way to get your results is to activate your My Chart account. Instructions are located on the last page of this paperwork. If you have not heard from us regarding the results in 2 weeks, please contact this office.      

## 2016-04-09 NOTE — Progress Notes (Signed)
MRN: [id number] DOB: 08/22/1970  Subjective:   Patrick Russell is a 45 y.o. male presenting for follow up on wound care. This is he third visit. He was here 10/31 for abscess under right breast, received an I&D, wound was packed and he was placed on course of Doxycyline.  Today he notes much improvement. At his last visit he was prescribed pain medication, however he states he never filled it because he didn't need it. Changing his dressing at home with no reported problems.  Denies fevers, chills, increasing pain, drainage from wound, increased warmth, swelling.   Patrick Russell has a current medication list which includes the following prescription(s): amlodipine, colchicine, doxycycline, losartan-hydrochlorothiazide, and tramadol. Also has No Known Allergies.  Patrick Russell  has a past medical history of Gout; Hypertension; Obesity; Subclinical hyperthyroidism; Tinea pedis; and Tobacco use. Also  has no past surgical history on file.  Objective:   Vitals: BP 134/82 (BP Location: Right Arm, Patient Position: Sitting, Cuff Size: Large)   Pulse 75   Temp 98.3 F (36.8 C) (Oral)   Resp 18   Ht 5\' 8"  (1.727 m)   SpO2 97%   Physical Exam  Constitutional: He is oriented to person, place, and time. He appears well-developed and well-nourished. No distress.  Cardiovascular: Normal rate and regular rhythm.   Pulmonary/Chest: Effort normal. No respiratory distress.  Neurological: He is alert and oriented to person, place, and time.  Skin: Skin is warm and dry.  Packing in place inferior right breast. Packing removed without difficulty. Wound irrigated with 5 cc 1% lidocaine. Wound explored with hemostats.  Healing well, no prurulent drainage. Area of induration reduced from his last visit. Wound was lightly repacked with 1/4 inch packing and dressed.   Psychiatric: He has a normal mood and affect. His behavior is normal. Judgment and thought content normal.    No results found for this or any previous  visit (from the past 24 hour(s)).  Assessment and Plan :  1. Encounter for wound care - Healing well, his level of pain has reduced significantly from his last visit, which is encouraging. Discussed with patient to continue keeping wound clean, dry and covered. RTC in 48 hours for wound care.   Marco Collie, PA-C  [facility] 04/09/2016 1:04 PM

## 2016-04-11 ENCOUNTER — Ambulatory Visit (INDEPENDENT_AMBULATORY_CARE_PROVIDER_SITE_OTHER): Payer: Commercial Managed Care - HMO | Admitting: Physician Assistant

## 2016-04-11 VITALS — BP 132/80 | HR 91 | Temp 98.4°F | Resp 16 | Ht 68.0 in | Wt 320.0 lb

## 2016-04-11 DIAGNOSIS — Z5189 Encounter for other specified aftercare: Secondary | ICD-10-CM

## 2016-04-11 DIAGNOSIS — L03313 Cellulitis of chest wall: Secondary | ICD-10-CM

## 2016-04-11 NOTE — Progress Notes (Signed)
Chief Complaint  Patient presents with  . Follow-up    wound care, chest     History of Present Illness: Patient presents for wound care.  He was seen initially on 04/05/2016 and found to have an abscess on the RIGHT anterior chest, in the crease below the breast. I&D was performed and he was started on Doxycycline. He returned 2 days later, as advised for wound care, and releated that he had not started the antibiotic. He was improving, and the wound culture revealed no growth, so antibiotic not needed at that point. He continued to improve and at the next visit, 11/04, he reported little pain.  Today he reports he continues to improve, has essentially no pain, though he still has purulence on the dressing when he changes it after bathing.  He did not get the message about the wound culture, so has just filled the prescription for the doxycyline, but hasn't started it yet. Advised of the NO GROWTH, and advised against taking the antibiotic at this point.   No Known Allergies  Prior to Admission medications   Not on File    Patient Active Problem List   Diagnosis Date Noted  . Diabetes mellitus type 2 in obese ([place]) 11/22/2015  . Gout 11/01/2015  . OSA (obstructive sleep apnea) 10/13/2015  . Low testosterone 05/02/2015  . Hypertension 07/16/2013  . History of tobacco use 07/16/2013  . Insomnia 07/16/2013  . Obesity      Physical Exam  Constitutional: He is oriented to person, place, and time. He appears well-developed and well-nourished. He is active and cooperative. No distress.  BP 132/80 (BP Location: Right Arm, Patient Position: Sitting, Cuff Size: Large)   Pulse 91   Temp 98.4 F (36.9 C) (Oral)   Resp 16   Ht 5\' 8"  (1.727 m)   Wt (!) 320 lb (145.2 kg)   SpO2 98%   BMI 48.66 kg/m    Eyes: Conjunctivae are normal.  Pulmonary/Chest: Effort normal.  Neurological: He is alert and oriented to person, place, and time.  Skin: Skin is warm and dry. Lesion noted.       Psychiatric: He has a normal mood and affect. His speech is normal and behavior is normal.    Wound cavity irrigated with 3 cc 2% lidocaine and gently repacked, with effort to place packing in the area of induration superior to the incision. Dressed with gauze and hypafix.  ASSESSMENT & PLAN:  1. Cellulitis of chest wall 2. Encounter for wound care Continue PRN dressing changes. Warm compresses. RTC in 72 hours to see Ms. McVey for wound care. If purulence continues, would consider secondary infection and repeat wound culture and starting the doxycycline that he filled.   Fara Chute, PA-C Physician Assistant-Certified [facility]

## 2016-04-11 NOTE — Patient Instructions (Signed)
     IF you received an x-ray today, you will receive an invoice from [facility]. Please contact [facility] at [phone] with questions or concerns regarding your invoice.   IF you received labwork today, you will receive an invoice from [facility]. Please contact [facility] at [phone] with questions or concerns regarding your invoice.   Our billing staff will not be able to assist you with questions regarding bills from these companies.  You will be contacted with the lab results as soon as they are available. The fastest way to get your results is to activate your My Chart account. Instructions are located on the last page of this paperwork. If you have not heard from us regarding the results in 2 weeks, please contact this office.      

## 2016-04-14 ENCOUNTER — Encounter: Payer: Self-pay | Admitting: Physician Assistant

## 2016-04-14 ENCOUNTER — Ambulatory Visit (INDEPENDENT_AMBULATORY_CARE_PROVIDER_SITE_OTHER): Payer: Commercial Managed Care - HMO | Admitting: Physician Assistant

## 2016-04-14 VITALS — BP 130/80 | HR 96 | Temp 98.2°F | Resp 16 | Ht 68.0 in | Wt 320.0 lb

## 2016-04-14 DIAGNOSIS — Z5189 Encounter for other specified aftercare: Secondary | ICD-10-CM

## 2016-04-14 DIAGNOSIS — L03313 Cellulitis of chest wall: Secondary | ICD-10-CM

## 2016-04-14 NOTE — Progress Notes (Signed)
MRN: [id number] DOB: August 15, 1970  Subjective:   Patrick Russell is a pleasant 45 y.o. male presenting for follow up on wound care.   Patrick Russell initially presented on 04/05/2016 and had abscess incised and drained on right anterior chest.  He has not taken antibiotics, as his wound culture showed no growth. Since that time, he has RTC q 48-72 hours for packing removal and repacking. His pain level has decreased significantly since his initial visit. Today he is feeling well with no complaints. Is changing his dressing regularly without difficulty.  Denies fever, chills, swelling of wound, drainage, increasing tenderness.   Patrick Russell currently has no medications in their medication list. Also has No Known Allergies.  Patrick Russell  has a past medical history of Gout; Hypertension; Obesity; Subclinical hyperthyroidism; Tinea pedis; and Tobacco use. Also  has no past surgical history on file.   Objective:   Vitals: BP 130/80 (BP Location: Right Arm, Patient Position: Sitting, Cuff Size: Large)   Pulse 96   Temp 98.2 F (36.8 C) (Oral)   Resp 16   Ht 5\' 8"  (1.727 m)   Wt (!) 320 lb (145.2 kg)   SpO2 96%   BMI 48.66 kg/m   Physical Exam  Constitutional: He is oriented to person, place, and time. He appears well-developed and well-nourished.  Cardiovascular: Normal rate and regular rhythm.   Pulmonary/Chest: Effort normal. No respiratory distress.  Neurological: He is alert and oriented to person, place, and time.  Skin: Skin is warm and dry.  Packing present and in place. Skin surrounding wound appears irritated. Packing removed and noted to have a moderate amount of prurulence. Wound edges are beefy red with healthy granulation tissue present. No drainage expressed. Wound explored and noted to be about 2cm deep, superior to incision.    Psychiatric: He has a normal mood and affect. His behavior is normal. Judgment and thought content normal.  Vitals reviewed.   No results found for this or any  previous visit (from the past 24 hour(s)).  Procedure: Verbal consent obtained.  Packing removed. 4cc of 2% Lidocaine used to irrigate wound. Wound explored and appreciated to be approximately 2cm in depth, superiorly to incision. Wound was lightly packed with 1/4 inch packing. Wound dressed and wound care discussed.    Assessment and Plan :  1. Encounter for wound care 2. Cellulitis of chest wall - Wound care discussed with patient. RTC in 48 hours for wound care. Consider repeat wound culture and starting Doxycycline if purulence still present on dressing and packing at next visit.   Marco Collie, PA-C  [facility] 04/14/2016 7:03 PM

## 2016-04-14 NOTE — Patient Instructions (Signed)
     IF you received an x-ray today, you will receive an invoice from [facility]. Please contact [facility] at [phone] with questions or concerns regarding your invoice.   IF you received labwork today, you will receive an invoice from [facility]. Please contact [facility] at [phone] with questions or concerns regarding your invoice.   Our billing staff will not be able to assist you with questions regarding bills from these companies.  You will be contacted with the lab results as soon as they are available. The fastest way to get your results is to activate your My Chart account. Instructions are located on the last page of this paperwork. If you have not heard from us regarding the results in 2 weeks, please contact this office.      

## 2016-04-16 ENCOUNTER — Ambulatory Visit (INDEPENDENT_AMBULATORY_CARE_PROVIDER_SITE_OTHER): Payer: Commercial Managed Care - HMO | Admitting: Physician Assistant

## 2016-04-16 VITALS — BP 132/84 | HR 70 | Temp 98.1°F | Resp 18 | Ht 68.0 in | Wt 320.0 lb

## 2016-04-16 DIAGNOSIS — L0291 Cutaneous abscess, unspecified: Secondary | ICD-10-CM

## 2016-04-16 NOTE — Progress Notes (Signed)
MRN: [id number] DOB: Jun 07, 1970  Subjective:   Patrick Russell is a pleasant 45 y.o. male presenting for follow up on wound care.  His initial visit was 04/05/2016, had abscess on right anterior chest incised and drained. WC showed no growth, there was no course of antibiotics. He has presented to clinic every 2-3 days for wound check and repacking.  Feeling well. Changes dressing regularly. Pain has reduced significantly. Denies drainage, swelling, redness, fever, chills.    Patrick Russell has a current medication list which includes the following prescription(s): amlodipine besylate and losartan potassium-hctz. Also has No Known Allergies.  Patrick Russell  has a past medical history of Gout; Hypertension; Obesity; Subclinical hyperthyroidism; Tinea pedis; and Tobacco use. Also  has no past surgical history on file.   Objective:   Vitals: BP 132/84   Pulse 70   Temp 98.1 F (36.7 C) (Oral)   Resp 18   Ht 5\' 8"  (1.727 m)   Wt (!) 320 lb (145.2 kg)   SpO2 98%   BMI 48.66 kg/m   Physical Exam  Constitutional: He is oriented to person, place, and time. He appears well-developed and well-nourished.  Cardiovascular: Normal rate and regular rhythm.   Pulmonary/Chest: Effort normal. No respiratory distress.  Neurological: He is alert and oriented to person, place, and time.  Skin: Skin is warm and dry.     Mild skin irritation and erythema surrounding wound, sensitivity to tape suspected. Packing removed, moderate amount of purulence on packing. Deep palpation expresses minimal amount of drainage.  Wound culture taken. 1 cm incision appears beefy red with granulation tissue and healing well. Wound irrigated with 5cc 1% Lidocaine. 1.5 cm gray macerated cyst pocket flushed out of wound. Wound explored and appears to be 1.5 cm in depth, superior to incision site.       Psychiatric: He has a normal mood and affect. His behavior is normal. Judgment and thought content normal.  Vitals reviewed.   No  results found for this or any previous visit (from the past 24 hour(s)).  Assessment and Plan :  1. Abscess - WOUND CULTURE - Remnant of cyst pocket removed with irrigation, hopefully this will help wound healing. Wound lightly repacked with 1/4 in packing and dressed. Wound care discussed. RTC in 3 days for wound care. Consider treatment with antibiotics if culture results positive for bacteria.    Marco Collie, PA-C  [facility] 04/16/2016 12:30 PM

## 2016-04-16 NOTE — Patient Instructions (Signed)
     IF you received an x-ray today, you will receive an invoice from [facility]. Please contact [facility] at [phone] with questions or concerns regarding your invoice.   IF you received labwork today, you will receive an invoice from [facility]. Please contact [facility] at [phone] with questions or concerns regarding your invoice.   Our billing staff will not be able to assist you with questions regarding bills from these companies.  You will be contacted with the lab results as soon as they are available. The fastest way to get your results is to activate your My Chart account. Instructions are located on the last page of this paperwork. If you have not heard from us regarding the results in 2 weeks, please contact this office.      

## 2016-04-19 ENCOUNTER — Ambulatory Visit (INDEPENDENT_AMBULATORY_CARE_PROVIDER_SITE_OTHER): Payer: Commercial Managed Care - HMO | Admitting: Physician Assistant

## 2016-04-19 VITALS — BP 140/82 | HR 88 | Temp 98.1°F | Ht 68.0 in

## 2016-04-19 DIAGNOSIS — N611 Abscess of the breast and nipple: Secondary | ICD-10-CM

## 2016-04-19 DIAGNOSIS — Z5189 Encounter for other specified aftercare: Secondary | ICD-10-CM

## 2016-04-19 LAB — WOUND CULTURE
Gram Stain: NONE SEEN
Gram Stain: NONE SEEN

## 2016-04-19 MED ORDER — CIPROFLOXACIN HCL 500 MG PO TABS: 500.0000 mg | ORAL_TABLET | Freq: Two times a day (BID) | ORAL | 0 refills | Status: AC

## 2016-04-19 NOTE — Patient Instructions (Signed)
     IF you received an x-ray today, you will receive an invoice from [facility]. Please contact [facility] at [phone] with questions or concerns regarding your invoice.   IF you received labwork today, you will receive an invoice from [facility]. Please contact [facility] at [phone] with questions or concerns regarding your invoice.   Our billing staff will not be able to assist you with questions regarding bills from these companies.  You will be contacted with the lab results as soon as they are available. The fastest way to get your results is to activate your My Chart account. Instructions are located on the last page of this paperwork. If you have not heard from us regarding the results in 2 weeks, please contact this office.      

## 2016-04-19 NOTE — Addendum Note (Signed)
Addended by: Dorise Hiss on: 04/19/2016 03:14 PM   Modules accepted: Orders

## 2016-04-19 NOTE — Progress Notes (Signed)
MRN: [id number] DOB: 26-Dec-1970  Subjective:   Patrick Russell is a 45 y.o. male presenting for follow up on abscess on right breast.  His packing fell out two days ago while he was changing dressing.  Denies drainage, increasing redness or tenderness/pain, fever, chills.  He says overall he is feeling better, huge reduction in pain - he was prescribed tramadol a week and a half ago, however he never filled the prescription because his pain level improved.   10/31 -  I&D for abscess underneath right breast. Completed one round of Doxycycline. Follow-ups every 48-60 hours for wound repacking. Changes dressing at home. Keeps wound clean and dry.    Patrick Russell has a current medication list which includes the following prescription(s): amlodipine besylate, ciprofloxacin, and losartan potassium-hctz. Also has No Known Allergies.  Patrick Russell  has a past medical history of Gout; Hypertension; Obesity; Subclinical hyperthyroidism; Tinea pedis; and Tobacco use. Also  has no past surgical history on file.   Objective:   Vitals: BP 140/82 (BP Location: Right Arm, Patient Position: Sitting, Cuff Size: Large)   Pulse 88   Temp 98.1 F (36.7 C) (Oral)   Ht 5\' 8"  (1.727 m)   SpO2 98%   Physical Exam  Constitutional: He is oriented to person, place, and time. He appears well-developed and well-nourished.  Cardiovascular: Normal rate and regular rhythm.   Pulmonary/Chest: Effort normal. No respiratory distress.  Neurological: He is alert and oriented to person, place, and time.  Skin: Skin is warm and dry.     Psychiatric: He has a normal mood and affect. His behavior is normal. Judgment and thought content normal.  Vitals reviewed.   No results found for this or any previous visit (from the past 24 hour(s)).  Assessment and Plan :  1. Visit for wound care 2. Abscess of male breast - Wound repacked with 1/4 inch packing and dressed. RTC 3 days for wound check. Wound care discussed with patient.  Informed patient bacteria is present on wound culture, which was taken at his last appointment. His clinical picture is improving and wound is healing well. Will not treat at this time. Reserve treatment for worsening clinical presentation.   Marco Collie, PA-C  [facility] 04/19/2016 4:48 PM

## 2016-04-19 NOTE — Progress Notes (Signed)
Please call patient: Bacteria grew on his wound culture. A prescription of Ciprofloxacin is waiting for him at his pharmacy. Thank you!

## 2016-04-22 ENCOUNTER — Ambulatory Visit (INDEPENDENT_AMBULATORY_CARE_PROVIDER_SITE_OTHER): Payer: Commercial Managed Care - HMO | Admitting: Physician Assistant

## 2016-04-22 VITALS — BP 150/100 | HR 99 | Temp 98.5°F | Ht 68.0 in | Wt 320.0 lb

## 2016-04-22 DIAGNOSIS — I10 Essential (primary) hypertension: Secondary | ICD-10-CM

## 2016-04-22 DIAGNOSIS — Z5189 Encounter for other specified aftercare: Secondary | ICD-10-CM

## 2016-04-22 DIAGNOSIS — L03313 Cellulitis of chest wall: Secondary | ICD-10-CM | POA: Diagnosis not present

## 2016-04-22 NOTE — Progress Notes (Signed)
Subjective:    Patient ID: Patrick Russell, male    DOB: 03-Aug-1970, 45 y.o.   MRN: [id number]  Chief Complaint  Patient presents with  . Wound Check    Right chest area  . wound drainage    bandages wet   HPI: Presents for wound care of abscess on right breast. I&D performed on 04/05/2016 and patient has been seen every 48-72 hours since for wound care and re-packing of the wound. Wound culture from original I&D on 10/31 demonstrated no growth and patient had not yet started Doxycycline course, therefore was recommended not to take the antibiotic. Tramadol was given for pain at visit on 04/07/16, which he states he never filled because the pain was tolerable. Significant pain improvement at each of the following visits on 11/4, 11/6, 11/9, and 11/11. Wound culture repeated at visit on 11/11 due to continued drainage from the site which revealed Enterobacter Aerogens. Patient was then placed on Ciprofloxacin which he is still taking. States he has been changing the dressing every 2 days. Notes some purulent drainage occasionally. Denies fevers, chills, nausea, vomiting. States pain has improved greatly.  States he is currently have a gout flare and taking Colchicine.   Review of Systems Pertinent ROS mentioned above.  No Known Allergies  Prior to Admission medications   Medication Sig Start Date End Date Taking? Authorizing Provider  AmLODIPine Besylate (NORVASC PO) Take by mouth.   Yes Historical Provider, MD  ciprofloxacin (CIPRO) 500 MG tablet Take 1 tablet (500 mg total) by mouth 2 (two) times daily. 04/19/16 04/26/16 Yes Elizabeth Whitney McVey, PA-C  LOSARTAN POTASSIUM-HCTZ PO Take by mouth daily.   Yes Historical Provider, MD   Patient Active Problem List   Diagnosis Date Noted  . Diabetes mellitus type 2 in obese ([facility]) 11/22/2015  . Gout 11/01/2015  . OSA (obstructive sleep apnea) 10/13/2015  . Low testosterone 05/02/2015  . Hypertension 07/16/2013  . History of tobacco use  07/16/2013  . Insomnia 07/16/2013  . Obesity       Objective: Blood pressure (!) 150/92, pulse 99, temperature 98.5 F (36.9 C), temperature source Oral, height 5\' 8"  (1.727 m), weight (!) 320 lb (145.2 kg).   Physical Exam  Constitutional: He is oriented to person, place, and time. He appears well-developed and well-nourished. No distress.  HENT:  Head: Normocephalic and atraumatic.  Neck: Normal range of motion.  Pulmonary/Chest: Effort normal. No respiratory distress.  Neurological: He is alert and oriented to person, place, and time.  Skin: Skin is warm, dry and intact. No ecchymosis and no rash noted. He is not diaphoretic. No cyanosis or erythema. No pallor. Nails show no clubbing.     Psychiatric: He has a normal mood and affect. His speech is normal and behavior is normal. Thought content normal.   Results for orders placed or performed in visit on 04/16/16  WOUND CULTURE  Result Value Ref Range   Culture Few ENTEROBACTER AEROGENES    Gram Stain Rare    Gram Stain WBC present-predominately PMN    Gram Stain No Squamous Epithelial Cells Seen    Gram Stain No Organisms Seen    Organism ID, Bacteria ENTEROBACTER AEROGENES       Susceptibility   Enterobacter aerogenes -  (no method available)    AMOX/CLAVULANIC >=32 Resistant     PIP/TAZO <=4 Sensitive     IMIPENEM <=0.25 Sensitive     CEFAZOLIN >=64 Resistant     CEFTRIAXONE <=1 Sensitive  CEFTAZIDIME <=1 Sensitive     CEFEPIME <=1 Sensitive     GENTAMICIN <=1 Sensitive     TOBRAMYCIN <=1 Sensitive     CIPROFLOXACIN <=0.25 Sensitive     LEVOFLOXACIN <=0.12 Sensitive     TRIMETH/SULFA* <=20 Sensitive      * NR=NOT REPORTABLE,SEE COMMENTORAL therapy:A cefazolin MIC of <32 predicts susceptibility to the oral agents cefaclor,cefdinir,cefpodoxime,cefprozil,cefuroxime,cephalexin,and loracarbef when used for therapy of uncomplicated UTIs due to E.coli,K.pneumomiae,and P.mirabilis. PARENTERAL therapy: A cefazolinMIC of >8  indicates resistance to parenteralcefazolin. An alternate test method must beperformed to confirm susceptibility to parenteralcefazolin.      Assessment & Plan:  1. Encounter for wound care 2. Cellulitis of chest wall Dressing removed today revealing scant purulent then serosanguinous drainage. Wound not needed to be repacked today. Bandage placed and advised patient to allow wound to heal on its own. Instructed to complete course of Ciprofloxacin. RTC if abscess symptoms become worse again or do not fully improve/heal.  3. Essential hypertension Elevated blood pressure during visit today, first set of vitals with BP of 150/92 mmHg then repeated to be 150/100 mmHg. Patient states he has a current gout flare and just got off work which may be the cause. HCTZ dose increased at visit on 10/31, currently taking HCTZ and Norvasc. Advised patient to check his blood pressure at least 2x/week at home and RTC if multiple readings above 140/90 mmHg

## 2016-04-22 NOTE — Patient Instructions (Addendum)
  Check your blood pressure twice a week for three weeks. If you have multiple readings (more than half the time) over 140/90 mmHg, please come back to see [place].  Otherwise, follow-up in January for follow-up as originally planned.   IF you received an x-ray today, you will receive an invoice from [facility]. Please contact [facility] at [phone] with questions or concerns regarding your invoice.   IF you received labwork today, you will receive an invoice from [employer]. Please contact [facility] at [phone] with questions or concerns regarding your invoice.   Our billing staff will not be able to assist you with questions regarding bills from these companies.  You will be contacted with the lab results as soon as they are available. The fastest way to get your results is to activate your My Chart account. Instructions are located on the last page of this paperwork. If you have not heard from [place] regarding the results in 2 weeks, please contact this office.

## 2016-05-02 NOTE — Progress Notes (Signed)
Chief Complaint  Patient presents with  . Wound Check    Right chest area  . wound drainage    bandages wet    History of Present Illness: Patient presents for wound care, following I&D of infected cyst of the RIGHT chest wall on 04/05/2016.   On 04/05/2016, he was prescribed doxycycline. He returned 2 days later, as advised for wound care, and releated that he had not started the antibiotic. He was improving, and the wound culture revealed no growth, so antibiotic not needed at that point. He continued to improve and at the next visit, 11/04, he reported little pain.  At follow-up 11/06 he reported continued improvement, essentially no pain, though he still had purulence on the dressing when he changed it after bathing. He did not get the message about the wound culture being negative, so had just filled the prescription for the doxycyline, but hadn't started it yet. Advised of the NO GROWTH, and advised against taking the antibiotic at that point.  He was doing well on 11/09 and 11/11, but on 11/11 there was increased purluence and wound culture repeated.  At follow-up 11/14 he was doing much better. Due to Enterobacter aerogens, he was started on ciprofloxacin, which he is tolerating well.  No fever/chills.  He notes that he has developed a flare of gout and is using colchicine.    No Known Allergies  Prior to Admission medications   Medication Sig Start Date End Date Taking? Authorizing Provider  AmLODIPine Besylate (NORVASC PO) Take by mouth.   Yes Historical Provider, MD  LOSARTAN POTASSIUM-HCTZ PO Take by mouth daily.   Yes Historical Provider, MD    Patient Active Problem List   Diagnosis Date Noted  . Diabetes mellitus type 2 in obese ([place]) 11/22/2015  . Gout 11/01/2015  . OSA (obstructive sleep apnea) 10/13/2015  . Low testosterone 05/02/2015  . Hypertension 07/16/2013  . History of tobacco use 07/16/2013  . Insomnia 07/16/2013  . Obesity      Physical Exam    Constitutional: He is oriented to person, place, and time. He appears well-developed and well-nourished. He is active and cooperative. No distress.  BP (!) 150/100 (BP Location: Right Arm, Patient Position: Sitting, Cuff Size: Large)   Pulse 99   Temp 98.5 F (36.9 C) (Oral)   Ht 5\' 8"  (1.727 m)   Wt (!) 320 lb (145.2 kg)   BMI 48.66 kg/m   HENT:  Head: Normocephalic and atraumatic.  Right Ear: Hearing normal.  Left Ear: Hearing normal.  Eyes: Conjunctivae are normal. No scleral icterus.  Neck: Normal range of motion. Neck supple. No thyromegaly present.  Cardiovascular: Normal rate, regular rhythm and normal heart sounds.   Pulses:      Radial pulses are 2+ on the right side, and 2+ on the left side.  Pulmonary/Chest: Effort normal and breath sounds normal.  Lymphadenopathy:       Head (right side): No tonsillar, no preauricular, no posterior auricular and no occipital adenopathy present.       Head (left side): No tonsillar, no preauricular, no posterior auricular and no occipital adenopathy present.    He has no cervical adenopathy.       Right: No supraclavicular adenopathy present.       Left: No supraclavicular adenopathy present.  Neurological: He is alert and oriented to person, place, and time. No sensory deficit.  Skin: Skin is warm, dry and intact. No rash noted. No cyanosis or erythema. Nails show no  clubbing.     Psychiatric: He has a normal mood and affect. His speech is normal and behavior is normal.      ASSESSMENT & PLAN:  1. Encounter for wound care 2. Cellulitis of chest wall Complete ciprofloxacin. Local wound care. RTC PRN.  3. Essential hypertension Uncontrolled today, possibly due to pain of gout flare. HCTZ dose increased 10/31, he continues on amlodipine. May not tolerate increased HCTZ if has continued increased gout. Home BP checks 2x/week. If consistently >140/90 he's to let me know. Would stop diuretic and add ACEI.  Fara Chute,  PA-C Physician Assistant-Certified [facility]

## 2016-05-11 ENCOUNTER — Other Ambulatory Visit: Payer: Self-pay | Admitting: Physician Assistant

## 2016-05-11 DIAGNOSIS — M109 Gout, unspecified: Secondary | ICD-10-CM

## 2016-05-11 NOTE — Telephone Encounter (Signed)
Patient is out of medication and is currently having a gout flare up. He wants to know if he can get his refill today. I informed patient of the 24-48 hours turn around time

## 2016-05-11 NOTE — Telephone Encounter (Signed)
Last ov 04/2016  Last labs 03/2016

## 2016-05-12 NOTE — Telephone Encounter (Signed)
Patrick Russell is in pain - requesting a refill on his Gout medication.

## 2016-05-13 NOTE — Telephone Encounter (Signed)
Meds ordered this encounter  Medications  . Colchicine 0.6 MG CAPS    Sig: TAKE ONE CAPSULE BY MOUTH AS NEEDED    Dispense:  60 capsule    Refill:  0    PATIENT IS OUT REFILLS FOR THIS MEDICATION HAVING A CURRENT ATTACK

## 2016-05-13 NOTE — Telephone Encounter (Signed)
Notified pt of RF 

## 2016-06-04 ENCOUNTER — Other Ambulatory Visit: Payer: Self-pay | Admitting: Physician Assistant

## 2016-06-04 DIAGNOSIS — M109 Gout, unspecified: Secondary | ICD-10-CM

## 2016-06-07 ENCOUNTER — Other Ambulatory Visit: Payer: Self-pay | Admitting: Physician Assistant

## 2016-06-07 DIAGNOSIS — M109 Gout, unspecified: Secondary | ICD-10-CM

## 2016-06-17 DIAGNOSIS — M170 Bilateral primary osteoarthritis of knee: Secondary | ICD-10-CM | POA: Diagnosis not present

## 2016-06-24 ENCOUNTER — Encounter: Payer: Self-pay | Admitting: Physician Assistant

## 2016-06-24 DIAGNOSIS — M170 Bilateral primary osteoarthritis of knee: Secondary | ICD-10-CM

## 2016-07-24 ENCOUNTER — Other Ambulatory Visit: Payer: Self-pay | Admitting: Physician Assistant

## 2016-07-24 DIAGNOSIS — M109 Gout, unspecified: Secondary | ICD-10-CM

## 2016-07-25 NOTE — Telephone Encounter (Signed)
Last ov 04/2016

## 2016-07-26 NOTE — Telephone Encounter (Signed)
Meds ordered this encounter  Medications  . Colchicine 0.6 MG CAPS    Sig: TAKE ONE CAPSULE BY MOUTH AS NEEDED    Dispense:  60 capsule    Refill:  0   Patient notified via My Chart.

## 2016-09-24 ENCOUNTER — Other Ambulatory Visit: Payer: Self-pay | Admitting: Physician Assistant

## 2016-09-24 DIAGNOSIS — M109 Gout, unspecified: Secondary | ICD-10-CM

## 2016-09-24 NOTE — Telephone Encounter (Signed)
Please call this patient to schedule follow-up HTN with me.  Colchicine refill authorized. Meds ordered this encounter  Medications  . Colchicine 0.6 MG CAPS    Sig: TAKE ONE CAPSULE BY MOUTH AS NEEDED    Dispense:  60 capsule    Refill:  0

## 2016-09-26 NOTE — Telephone Encounter (Signed)
MyChart message sent to patient about making an appointment with Chelle for follow up on HTN.

## 2016-10-24 ENCOUNTER — Other Ambulatory Visit: Payer: Self-pay | Admitting: Physician Assistant

## 2016-10-24 DIAGNOSIS — M109 Gout, unspecified: Secondary | ICD-10-CM

## 2016-10-25 NOTE — Telephone Encounter (Signed)
Last seen 04/2016

## 2016-10-26 NOTE — Telephone Encounter (Signed)
Last refill:09/26/2016

## 2016-10-26 NOTE — Telephone Encounter (Signed)
Patient notified via My Chart.  Meds ordered this encounter  Medications  . Colchicine 0.6 MG CAPS    Sig: TAKE ONE CAPSULE BY MOUTH TWICE A DAY AS NEEDED    Dispense:  60 capsule    Refill:  0

## 2016-11-03 IMAGING — CR DG KNEE 1-2V*L*
2 series · 2 of 2 positions shown · non-contrast
Comparison: None.

CLINICAL DATA: Knee pain for 3 weeks.  Initial evaluation.

EXAM:
LEFT KNEE - 1-2 VIEW

[w knee ap left]
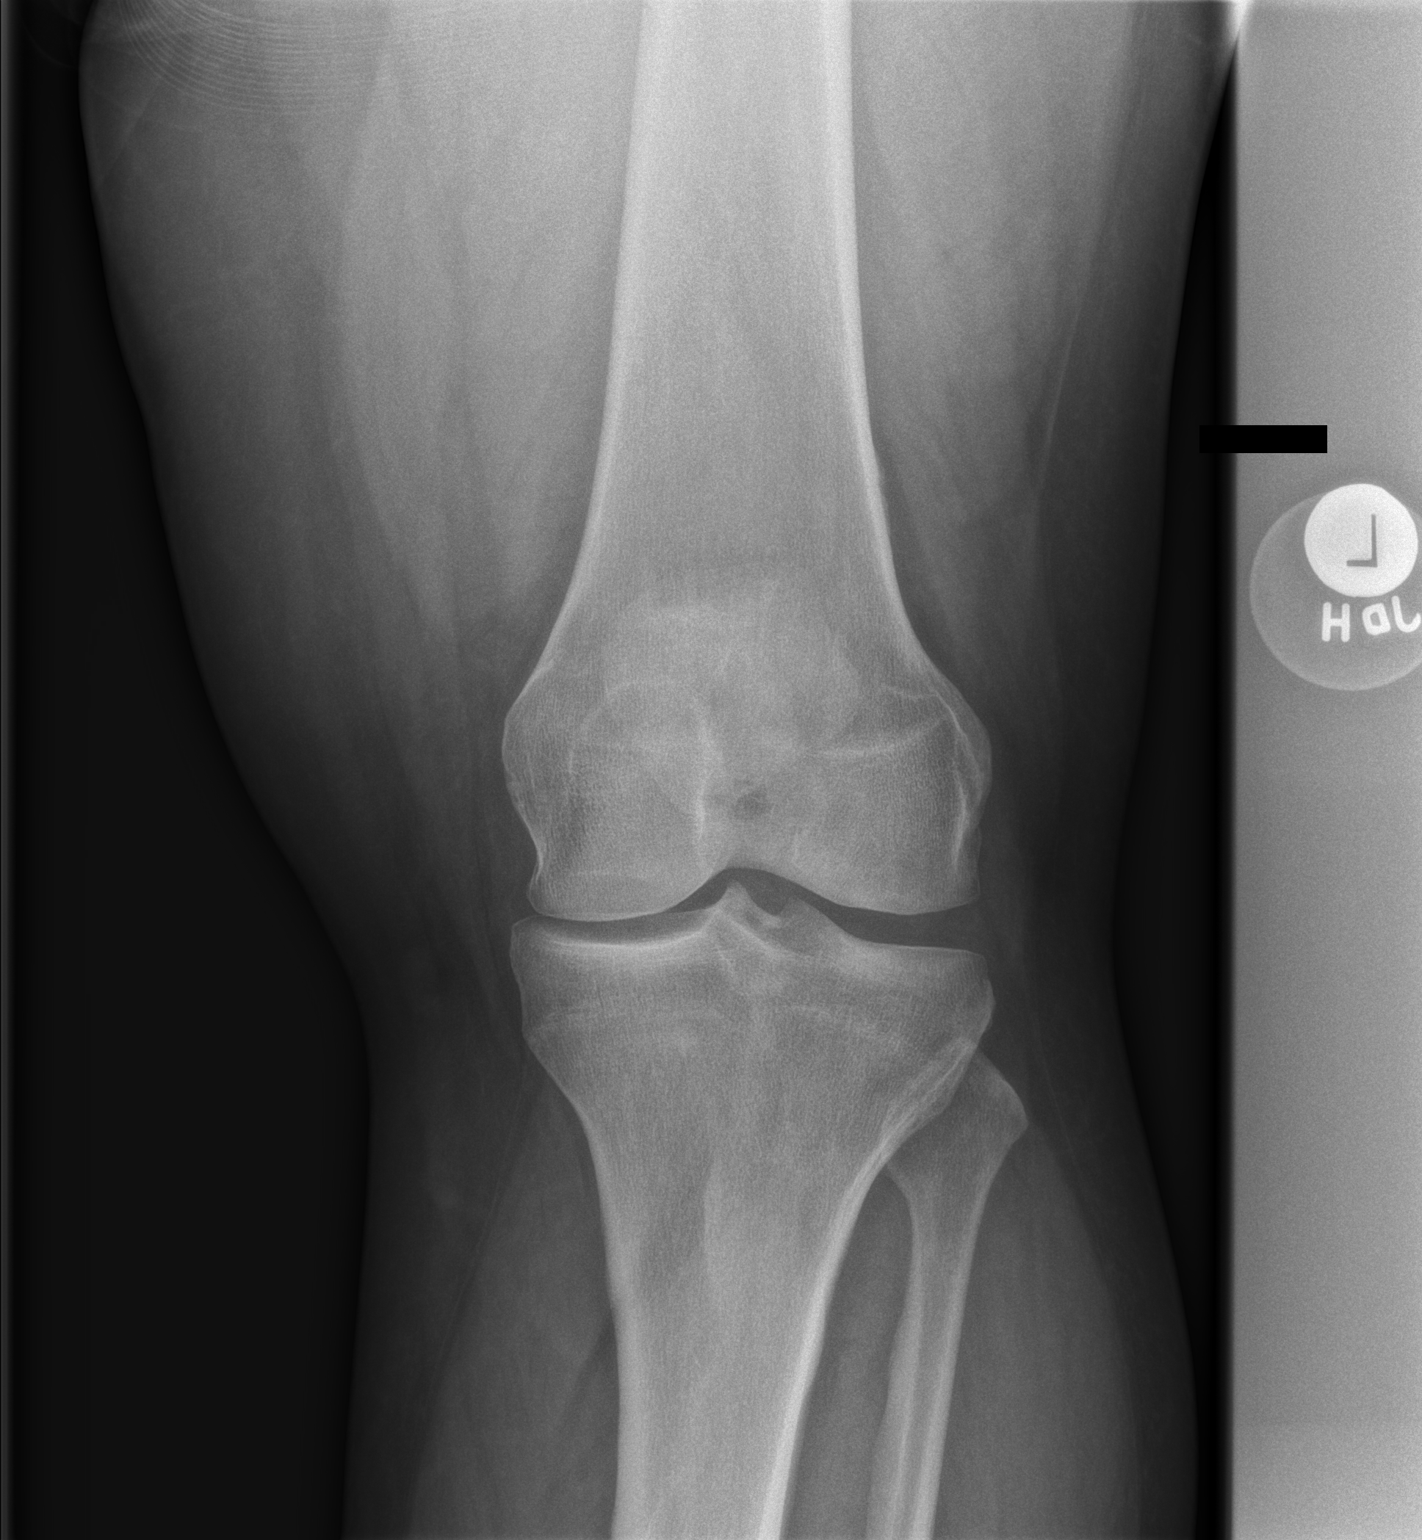

[w knee lat left]
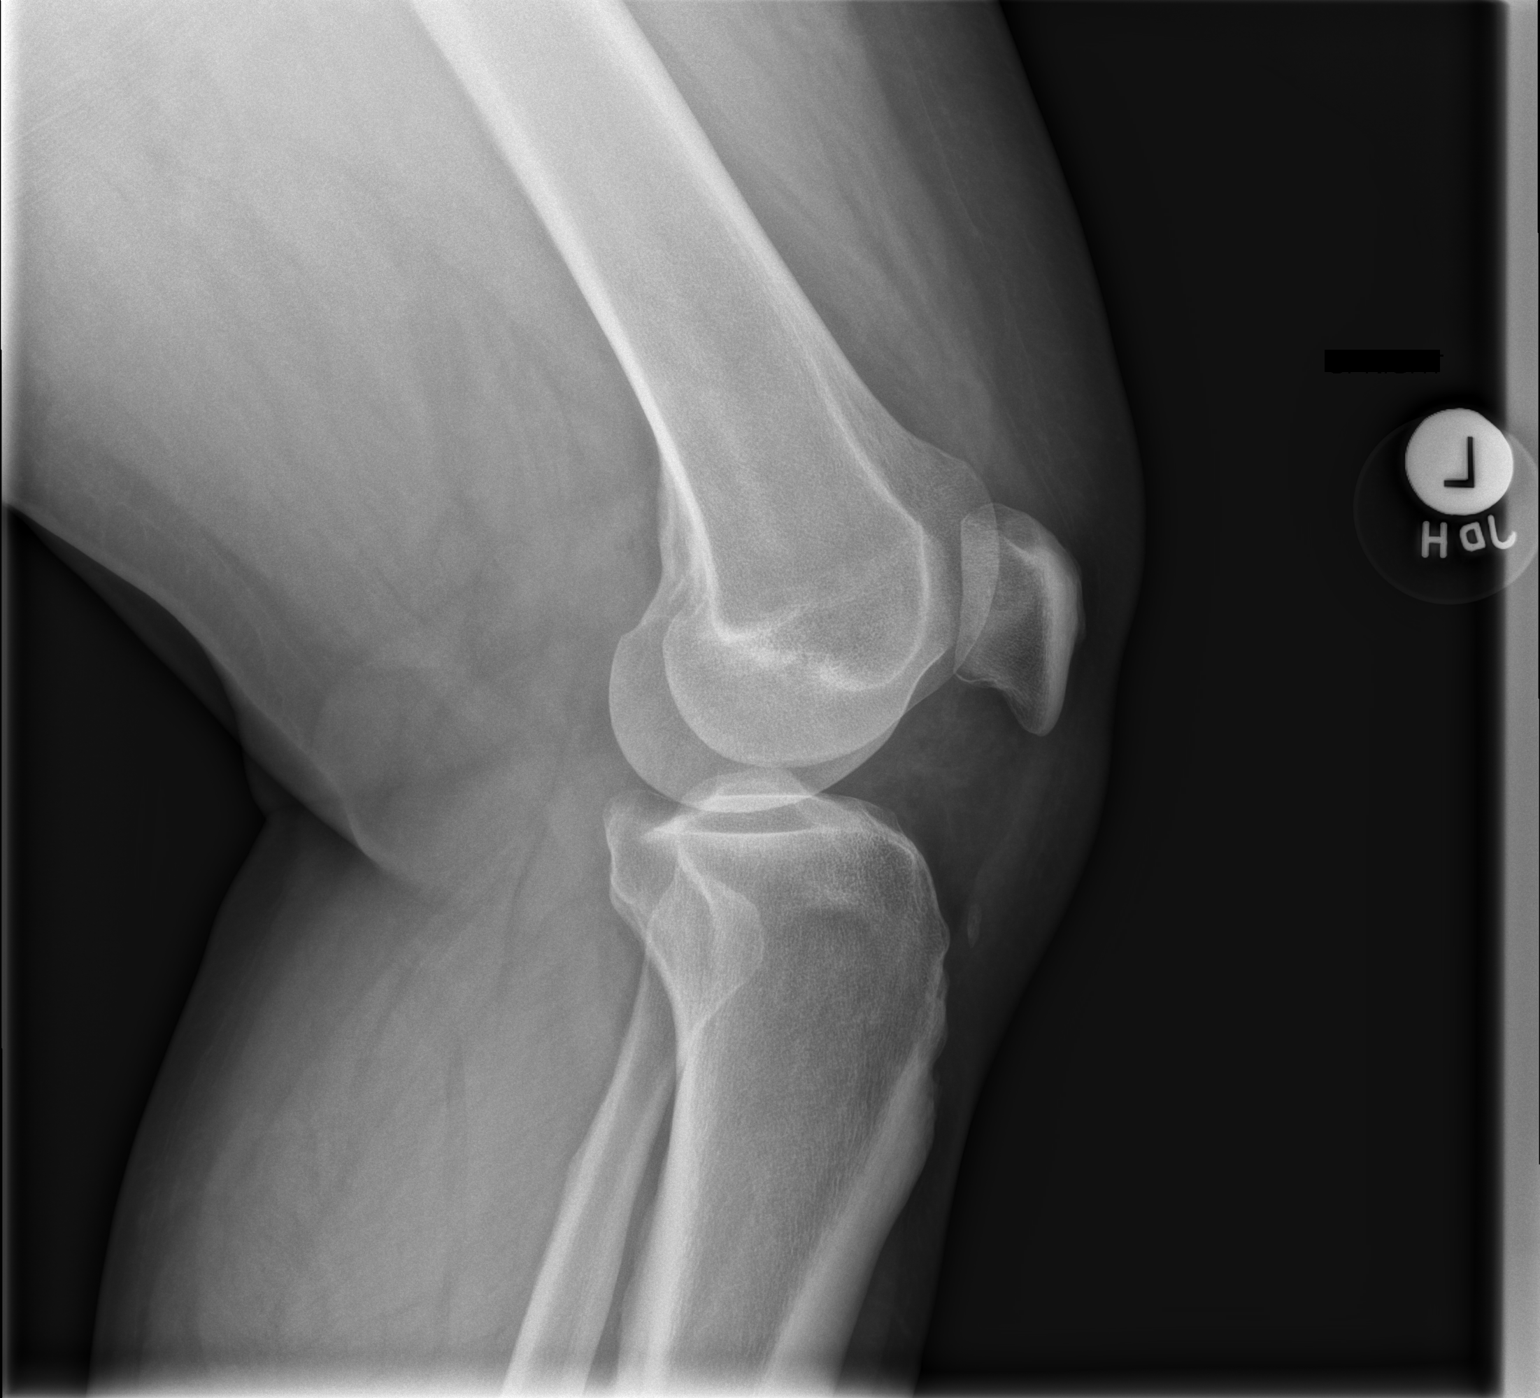

[2 of 2 positions shown; findings below may reference images not displayed]

FINDINGS: No acute bony or joint abnormality identified. No evidence of
fracture or dislocation.
IMPRESSION: No acute or focal abnormality.

## 2016-11-09 ENCOUNTER — Telehealth: Payer: Self-pay | Admitting: Physician Assistant

## 2016-11-09 NOTE — Telephone Encounter (Signed)
Please call this patient. He has not yet read my message to him in My Chart regarding what appears to be increased use of colchicine, and asking what his BP has been running at home.  If it's been >140/90 consistently, or he hasn't been checking it, I need to see him. If he's having more gout attacks, we likely need to change his BP regimen, as the HCTZ component in Hyzaar (losartan-HCTZ) can contribute to gout attacks.

## 2016-11-10 NOTE — Telephone Encounter (Signed)
Has had some gout flares  134/88 last few b/p range Due for px after 12/14/16, tx up front for ov (he will only come 1 q year because his insurance covers)

## 2016-12-14 ENCOUNTER — Other Ambulatory Visit: Payer: Self-pay | Admitting: Physician Assistant

## 2016-12-14 DIAGNOSIS — M109 Gout, unspecified: Secondary | ICD-10-CM

## 2016-12-26 ENCOUNTER — Other Ambulatory Visit: Payer: Self-pay | Admitting: Physician Assistant

## 2016-12-26 ENCOUNTER — Ambulatory Visit (INDEPENDENT_AMBULATORY_CARE_PROVIDER_SITE_OTHER): Payer: Commercial Managed Care - HMO | Admitting: Physician Assistant

## 2016-12-26 ENCOUNTER — Encounter: Payer: Self-pay | Admitting: Physician Assistant

## 2016-12-26 VITALS — BP 147/90 | HR 81 | Temp 98.0°F | Resp 18 | Ht 68.5 in | Wt 335.0 lb

## 2016-12-26 DIAGNOSIS — M791 Myalgia, unspecified site: Secondary | ICD-10-CM

## 2016-12-26 DIAGNOSIS — M79601 Pain in right arm: Secondary | ICD-10-CM

## 2016-12-26 DIAGNOSIS — I10 Essential (primary) hypertension: Secondary | ICD-10-CM

## 2016-12-26 DIAGNOSIS — M62838 Other muscle spasm: Secondary | ICD-10-CM | POA: Diagnosis not present

## 2016-12-26 MED ORDER — CYCLOBENZAPRINE HCL 5 MG PO TABS: 5.0000 mg | ORAL_TABLET | Freq: Three times a day (TID) | ORAL | 0 refills | Status: DC | PRN

## 2016-12-26 NOTE — Patient Instructions (Addendum)
We have obtained labs today and will contact you within the week with your results. Your pain is likely due to muscle spasm. I have given you a medication to help with this but it can cause drowsiness so do not take if you have to go to work. I also recommend applying heat to the affected area 4-5 x a day for 20 minutes at a time. I will also give you some stretches to do. Continue hydrating with lots of water. You may also use 600mg  ibuprofen every 8 hours until pain resolves. Please return to clinic if symptoms worsen, do not improve, or as needed. Thank you for letting me participate in your health and well being.   Elbow and Forearm Exercises Ask your health care provider which exercises are safe for you. Do exercises exactly as told by your health care provider and adjust them as directed. It is normal to feel mild stretching, pulling, tightness, or discomfort as you do these exercises, but you should stop right away if you feel sudden pain or your pain gets worse.Do not begin these exercises until told by your health care provider. RANGE OF MOTION EXERCISES These exercises warm up your muscles and joints and improve the movement and flexibility of your injured elbow and forearm. These exercises also help to relieve pain, numbness, and tingling.These exercises are done using the muscles in your injured elbow and forearm. Exercise A: Elbow Flexion, Active 1. Hold your left / right arm at your side, and bend your elbow as far as you can using your left / right arm muscles. 2. Hold this position for __________ seconds. 3. Slowly return to the starting position. Repeat __________ times. Complete this exercise __________ times a day. Exercise B: Elbow Extension, Active 1. Hold your left / right arm at your side, and straighten your elbow as much as you can using your left / right arm muscles. 2. Hold this position for __________ seconds. 3. Slowly return to the starting position. Repeat __________  times. Complete this exercise __________ times a day. Exercise C: Forearm Rotation, Supination, Active 1. Stand or sit with your elbows at your sides. 2. Bend your left / right elbow to an "L" shape (90 degrees). 3. Turn your palm upward until you feel a gentle stretch on the inside of your forearm. 4. Hold this position for __________ seconds. 5. Slowly release and return to the starting position. Repeat __________ times. Complete this exercise __________ times a day. Exercise D: Forearm Rotation, Pronation, Active 1. Stand or sit with your elbows at your side. 2. Bend your left / right elbow to an "L" shape (90 degrees). 3. Turn your left / right palm downward until you feel a gentle stretch on the top of your forearm. 4. Hold this position for __________ seconds. 5. Slowly release and return to the starting position. Repeat__________ times. Complete this exercise __________ times a day. STRETCHING EXERCISES These exercises warm up your muscles and joints and improve the movement and flexibility of your injured elbow and forearm. These exercises also help to relieve pain, numbness, and tingling.These exercises are done using your healthy elbow and forearm to help stretch the muscles in your injured elbow and forearm. Exercise E: Elbow Flexion, Active-Assisted  1. Hold your left / right arm at your side, and bend your elbow as much as you can using your left / right arm muscles. 2. Use your other hand to bend your left / right elbow farther. To do this, gently push  up on your forearm until you feel a gentle stretch on the back of your elbow. 3. Hold this position for __________ seconds. 4. Slowly return to the starting position. Repeat __________ times. Complete this exercise __________ times a day. Exercise F: Elbow Extension, Active-Assisted  1. Hold your left / right arm at your side, and straighten your elbow as much as you can using your left / right arm muscles. 2. Use your other  hand to straighten the left / right elbow farther. To do this, gently push down on your forearm until you feel a gentle stretch on the inside of your elbow. 3. Hold this position for __________ seconds. 4. Slowly return to the starting position. Repeat __________ times. Complete this exercise __________ times a day. Exercise G: Forearm Rotation, Supination, Active-Assisted  1. Sit with your left / right elbow bent in an "L" shape (90 degrees) with your forearm resting on a table. 2. Keeping your upper body and shoulder still, rotate your forearm so your left / right palm faces upward. 3. Use your other hand to help rotate your forearm further until you feel a gentle to moderate stretch. 4. Hold this position for __________ seconds. 5. Slowly release the stretch and return to the starting position. Repeat __________ times. Complete this exercise __________ times a day. Exercise H: Forearm Rotation, Pronation, Active-Assisted  1. Sit with your left / right elbow bent in an "L" shape (90 degrees) with your forearm resting on a table. 2. Keeping your upper body and shoulder still, rotate your forearm so your palm faces the tabletop. 3. Use your other hand to help rotate your forearm further until you feel a gentle to moderate stretch. 4. Hold this position for __________ seconds. 5. Slowly release the stretch and return to the starting position. Repeat __________ times. Complete this exercise __________ times a day. Exercise I: Elbow Flexion, Supine, Passive 1. Lie on your back. 2. Extend your left / right arm up in the air, bracing it with your other hand. 3. Let your left / right your hand slowly lower toward your shoulder, while your elbow stays pointed toward the ceiling. You should feel a gentle stretch along the back of your upper arm and elbow. 4. If instructed by your health care provider, you may increase the intensity of your stretch by adding a small wrist weight or hand  weight. 5. Hold this position for __________ seconds. 6. Slowly return to the starting position. Repeat __________ times. Complete this exercise __________ times a day. Exercise J: Elbow Extension, Supine, Passive  1. Lie on your back. Make sure that you are in a comfortable position that lets you relax your arm muscles. 2. Place a folded towel under your left / right upper arm so your elbow and shoulder are at the same height. Straighten your left / right arm so your elbow does not rest on the bed or towel. 3. Let the weight of your hand stretch your elbow. Keep your arm and chest muscles relaxed. You should feel a stretch on the inside of your elbow. 4. If told by your health care provider, you may increase the intensity of your stretch by adding a small wrist weight or hand weight. 5. Hold this position for__________ seconds. 6. Slowly release the stretch. Repeat __________ times. Complete this exercise __________ times a day. STRENGTHENING EXERCISES These exercises build strength and endurance in your elbow and forearm. Endurance is the ability to use your muscles for a long time, even  after they get tired. Exercise K: Elbow Flexion, Isometric  1. Stand or sit up straight. 2. Bend your left / right elbow in an "L" shape (90 degrees) and turn your palm up so your forearm is at the height of your waist. 3. Place your other hand on top of your forearm. Gently push down as your left / right arm resists. Push as hard as you can with both arms without causing any pain or movement at your left / right elbow. 4. Hold this position for __________ seconds. 5. Slowly release the tension in both arms. Let your muscles relax completely before repeating. Repeat __________ times. Complete this exercise __________ times a day. Exercise L: Elbow Extensors, Isometric  1. Stand or sit up straight. 2. Place your left / right arm so your palm faces your abdomen and it is at the height of your  waist. 3. Place your other hand on the underside of your forearm. Gently push up as your left / right arm resists. Push as hard as you can with both arms, without causing any pain or movement at your left / right elbow. 4. Hold this position for __________ seconds. 5. Slowly release the tension in both arms. Let your muscles relax completely before repeating. Repeat __________ times. Complete this exercise __________ times a day. Exercise M: Elbow Flexion With Forearm Palm Up  1. Sit upright on a firm chair without armrests, or stand. 2. Place your left / right arm at your side with your palm facing forward. 3. Holding a __________weight or gripping a rubber exercise band or tubing, bend your elbow to bring your hand toward your shoulder. 4. Hold this position for __________ seconds. 5. Slowly return to the starting position. Repeat __________times. Complete this exercise __________times a day. Exercise N: Elbow Extension  1. Sit on a firm chair without armrests, or stand. 2. Keeping your upper arms at your sides, bring both hands up toward your left / right shoulder while you grip a rubber exercise band or tubing. Your left / right hand should be just below the other hand. 3. Straighten your left / right elbow. 4. Hold this position for __________ seconds. 5. Control the resistance of the band or tubing as your hand returns to your side. Repeat __________times. Complete this exercise __________times a day. Exercise O: Forearm Rotation, Supination  1. Sit with your left / right forearm supported on a table. Keep your elbow at waist height. 2. Rest your hand over the edge of the table with your palm facing down. 3. Gently hold a lightweight hammer. 4. Without moving your elbow, slowly rotate your forearm to turn your palm and hand upward to a "thumbs-up" position. 5. Hold this position for __________ seconds. 6. Slowly return to the starting position. Repeat __________times. Complete this  exercise __________times a day. Exercise P: Forearm Rotation, Pronation  1. Sit with your left / right forearm supported on a table. Keep your elbow below shoulder height. 2. Rest your hand over the edge of the table with your palm facing up. 3. Gently hold a lightweight hammer. 4. Without moving your elbow, slowly rotate your forearm to turn your palm and hand upward to a "thumbs-up" position. 5. Hold this position for __________seconds. 6. Slowly return to the starting position. Repeat __________times. Complete this exercise __________times a day. This information is not intended to replace advice given to you by your health care provider. Make sure you discuss any questions you have with your health care provider.  Document Released: 04/06/2005 Document Revised: 10/01/2015 Document Reviewed: 02/15/2015 Elsevier Interactive Patient Education  2018 [place].   Muscle Cramps and Spasms Muscle cramps and spasms are when muscles tighten by themselves. They usually get better within minutes. Muscle cramps are painful. They are usually stronger and last longer than muscle spasms. Muscle spasms may or may not be painful. They can last a few seconds or much longer. Follow these instructions at home:  Drink enough fluid to keep your pee (urine) clear or pale yellow.  Massage, stretch, and relax the muscle.  If directed, apply heat to tight or tense muscles as often as told by your doctor. Use the heat source that your doctor recommends. ? Place a towel between your skin and the heat source. ? Leave the heat on for 20-30 minutes. ? Take off the heat if your skin turns bright red. This is especially important if you are unable to feel pain, heat, or cold. You may have a greater risk of getting burned.  If directed, put ice on the affected area. This may help if you are sore or have pain after a cramp or spasm. ? Put ice in a plastic bag. ? Place a towel between your skin and the  bag. ? Leave the ice on for 20 minutes, 2-3 times a day.  Take over-the-counter and prescription medicines only as told by your doctor.  Pay attention to any changes in your symptoms. Contact a doctor if:  Your cramps or spasms get worse or happen more often.  Your cramps or spasms do not get better with time. This information is not intended to replace advice given to you by your health care provider. Make sure you discuss any questions you have with your health care provider. Document Released: 05/05/2008 Document Revised: 06/24/2015 Document Reviewed: 02/24/2015 Elsevier Interactive Patient Education  2018 [employer].  IF you received an x-ray today, you will receive an invoice from [facility]. Please contact [facility] at [phone] with questions or concerns regarding your invoice.   IF you received labwork today, you will receive an invoice from [facility]. Please contact [facility] at [phone] with questions or concerns regarding your invoice.   Our billing staff will not be able to assist you with questions regarding bills from these companies.  You will be contacted with the lab results as soon as they are available. The fastest way to get your results is to activate your My Chart account. Instructions are located on the last page of this paperwork. If you have not heard from [place] regarding the results in 2 weeks, please contact this office.

## 2016-12-26 NOTE — Progress Notes (Signed)
Patrick Russell  MRN: [id number] DOB: 12-20-1970  Subjective:  Patrick Russell is a 46 y.o. male seen in office today for a chief complaint of right arm pain x 3 days. Has associated difficulty stretching his right arm out. Denies acute injury, increase in physical activity, redness, swelling, and numbness. He is a driver and uses both arms but could not use his right arm today due to the pain. He has not increased the amount of driving he does. Does note that he may have slept on it in an odd position last night. Has never had anything like this before. Has not tried anything for pain.   Review of Systems  Constitutional: Negative for chills, diaphoresis and fever.  Gastrointestinal: Negative for nausea and vomiting.  Genitourinary: Negative for decreased urine volume and hematuria.    Patient Active Problem List   Diagnosis Date Noted  . Primary osteoarthritis of knees, bilateral 06/24/2016  . Diabetes mellitus type 2 in obese ([facility]) 11/22/2015  . Gout 11/01/2015  . OSA (obstructive sleep apnea) 10/13/2015  . Low testosterone 05/02/2015  . Hypertension 07/16/2013  . History of tobacco use 07/16/2013  . Insomnia 07/16/2013  . Obesity     Current Outpatient Prescriptions on File Prior to Visit  Medication Sig Dispense Refill  . AmLODIPine Besylate (NORVASC PO) Take by mouth.    . Colchicine 0.6 MG CAPS TAKE ONE CAPSULE BY MOUTH TWICE A DAY AS NEEDED 60 capsule 0  . LOSARTAN POTASSIUM-HCTZ PO Take by mouth daily.     No current facility-administered medications on file prior to visit.     No Known Allergies   Objective:  BP (!) 147/90 (BP Location: Left Arm, Patient Position: Sitting, Cuff Size: Large)   Pulse 81   Temp 98 F (36.7 C) (Oral)   Resp 18   Ht 5' 8.5" (1.74 m)   Wt (!) 335 lb (152 kg)   SpO2 98%   BMI 50.19 kg/m   Physical Exam  Constitutional: He is oriented to person, place, and time and well-developed, well-nourished, and in no distress.  HENT:  Head:  Normocephalic and atraumatic.  Eyes: Conjunctivae are normal.  Neck: Normal range of motion.  Cardiovascular: Normal rate, regular rhythm and normal heart sounds.   Pulses:      Radial pulses are 2+ on the right side, and 2+ on the left side.  Pulmonary/Chest: Effort normal and breath sounds normal.  Musculoskeletal:       Right elbow: He exhibits decreased range of motion (decreased extension ). He exhibits no swelling. No tenderness found. No radial head, no medial epicondyle and no lateral epicondyle tenderness noted.       Right forearm: He exhibits tenderness (with palpation of brachioradialis, spasm noted ). He exhibits no bony tenderness and no swelling.       Right hand: Normal. He exhibits normal capillary refill.       Left hand: He exhibits normal capillary refill.  No erythema or warmth noted on right upper extremity.   Forearms measure 35cm bilaterally.   No pain pain with resisted wrist extension with the elbow in extension, no pain with resisted wrist flexion with the elbow in extension  Neurological: He is alert and oriented to person, place, and time. Gait normal.  Skin: Skin is warm and dry.  Psychiatric: Affect normal.  Vitals reviewed.   BP Readings from Last 3 Encounters:  12/26/16 (!) 147/90  04/22/16 (!) 150/100  04/19/16 140/82    Assessment and  Plan :  1. Right arm pain Hx and PE findings consistent with muscle spasm. Will check electrolytes and CK. Pt encouraged to apply heat to affected area, perform arm stretches, hydrate with at least 64 oz of water daily, and use OTC ibuprofen and flexeril until pain and spasm resolves. Given strict ED/return precautions.  2. Muscle pain - CMP14+EGFR - CK 3. Muscle spasm - cyclobenzaprine (FLEXERIL) 5 MG tablet; Take 1 tablet (5 mg total) by mouth 3 (three) times daily as needed for muscle spasms.  Dispense: 60 tablet; Refill: 0  Benjiman Core, PA-C  [facility] at [facility] 12/27/2016  12:26 PM

## 2016-12-27 LAB — CMP14+EGFR
A/G RATIO: 1.7 (ref 1.2–2.2)
ALBUMIN: 4.6 g/dL (ref 3.5–5.5)
ALT: 35 IU/L (ref 0–44)
AST: 33 IU/L (ref 0–40)
Alkaline Phosphatase: 67 IU/L (ref 39–117)
BILIRUBIN TOTAL: 0.4 mg/dL (ref 0.0–1.2)
BUN / CREAT RATIO: 18 (ref 9–20)
BUN: 16 mg/dL (ref 6–24)
CHLORIDE: 95 mmol/L — AB (ref 96–106)
CO2: 23 mmol/L (ref 20–29)
Calcium: 9.8 mg/dL (ref 8.7–10.2)
Creatinine, Ser: 0.89 mg/dL (ref 0.76–1.27)
GFR calc non Af Amer: 103 mL/min/{1.73_m2} (ref >59)
GFR, EST AFRICAN AMERICAN: 119 mL/min/{1.73_m2} (ref >59)
GLUCOSE: 103 mg/dL — AB (ref 65–99)
Globulin, Total: 2.7 g/dL (ref 1.5–4.5)
POTASSIUM: 3.6 mmol/L (ref 3.5–5.2)
SODIUM: 140 mmol/L (ref 134–144)
TOTAL PROTEIN: 7.3 g/dL (ref 6.0–8.5)

## 2016-12-27 LAB — CK: Total CK: 405 U/L — ABNORMAL HIGH (ref 24–204)

## 2016-12-30 DIAGNOSIS — H40023 Open angle with borderline findings, high risk, bilateral: Secondary | ICD-10-CM | POA: Diagnosis not present

## 2016-12-30 DIAGNOSIS — H2513 Age-related nuclear cataract, bilateral: Secondary | ICD-10-CM | POA: Diagnosis not present

## 2017-01-17 ENCOUNTER — Emergency Department (HOSPITAL_COMMUNITY)
Admission: EM | Admit: 2017-01-17 | Discharge: 2017-01-18 | Disposition: A | Discharge status: Home / Self Care | Payer: 59 | Attending: Emergency Medicine | Admitting: Emergency Medicine

## 2017-01-17 ENCOUNTER — Emergency Department (HOSPITAL_COMMUNITY): Payer: 59

## 2017-01-17 DIAGNOSIS — I10 Essential (primary) hypertension: Secondary | ICD-10-CM | POA: Diagnosis not present

## 2017-01-17 DIAGNOSIS — E119 Type 2 diabetes mellitus without complications: Secondary | ICD-10-CM | POA: Diagnosis not present

## 2017-01-17 DIAGNOSIS — R0602 Shortness of breath: Secondary | ICD-10-CM | POA: Diagnosis not present

## 2017-01-17 DIAGNOSIS — I517 Cardiomegaly: Secondary | ICD-10-CM | POA: Insufficient documentation

## 2017-01-17 DIAGNOSIS — Z79899 Other long term (current) drug therapy: Secondary | ICD-10-CM | POA: Insufficient documentation

## 2017-01-17 DIAGNOSIS — Z87891 Personal history of nicotine dependence: Secondary | ICD-10-CM | POA: Insufficient documentation

## 2017-01-17 DIAGNOSIS — M546 Pain in thoracic spine: Secondary | ICD-10-CM | POA: Diagnosis not present

## 2017-01-17 DIAGNOSIS — M545 Low back pain: Secondary | ICD-10-CM | POA: Diagnosis not present

## 2017-01-17 LAB — CBC
HCT: 47.6 % (ref 39.0–52.0)
HEMOGLOBIN: 15.8 g/dL (ref 13.0–17.0)
MCH: 26.8 pg (ref 26.0–34.0)
MCHC: 33.2 g/dL (ref 30.0–36.0)
MCV: 80.7 fL (ref 78.0–100.0)
PLATELETS: 302 10*3/uL (ref 150–400)
RBC: 5.9 MIL/uL — AB (ref 4.22–5.81)
RDW: 14.5 % (ref 11.5–15.5)
WBC: 8.3 10*3/uL (ref 4.0–10.5)

## 2017-01-17 LAB — COMPREHENSIVE METABOLIC PANEL
ALBUMIN: 4.4 g/dL (ref 3.5–5.0)
ALK PHOS: 56 U/L (ref 38–126)
ALT: 37 U/L (ref 17–63)
AST: 42 U/L — AB (ref 15–41)
Anion gap: 11 (ref 5–15)
BILIRUBIN TOTAL: 1 mg/dL (ref 0.3–1.2)
BUN: 13 mg/dL (ref 6–20)
CALCIUM: 9.3 mg/dL (ref 8.9–10.3)
CO2: 27 mmol/L (ref 22–32)
Chloride: 96 mmol/L — ABNORMAL LOW (ref 101–111)
Creatinine, Ser: 0.91 mg/dL (ref 0.61–1.24)
GFR calc Af Amer: >60 mL/min (ref >60)
GLUCOSE: 105 mg/dL — AB (ref 65–99)
POTASSIUM: 3.3 mmol/L — AB (ref 3.5–5.1)
Sodium: 134 mmol/L — ABNORMAL LOW (ref 135–145)
TOTAL PROTEIN: 7.9 g/dL (ref 6.5–8.1)

## 2017-01-17 LAB — LIPASE, BLOOD: LIPASE: 21 U/L (ref 11–51)

## 2017-01-17 LAB — I-STAT TROPONIN, ED: TROPONIN I, POC: 0 ng/mL (ref 0.00–0.08)

## 2017-01-17 MED ORDER — HYDROMORPHONE HCL 1 MG/ML IJ SOLN
1.0000 mg | Freq: Once | INTRAMUSCULAR | Status: AC
  Administered 2017-01-17: 1 mg via INTRAVENOUS
  Filled 2017-01-17: qty 1

## 2017-01-17 MED ORDER — LOSARTAN POTASSIUM-HCTZ 100-25 MG PO TABS: 1.0000 | ORAL_TABLET | Freq: Every day | ORAL | 0 refills | Status: DC

## 2017-01-17 MED ORDER — KETOROLAC TROMETHAMINE 30 MG/ML IJ SOLN
30.0000 mg | Freq: Once | INTRAMUSCULAR | Status: AC
  Administered 2017-01-17: 30 mg via INTRAVENOUS
  Filled 2017-01-17: qty 1

## 2017-01-17 MED ORDER — IOPAMIDOL (ISOVUE-370) INJECTION 76%
INTRAVENOUS | Status: AC
  Administered 2017-01-17: 100 mL
  Filled 2017-01-17: qty 100

## 2017-01-17 MED ORDER — HYDROCODONE-ACETAMINOPHEN 5-325 MG PO TABS: 1.0000 | ORAL_TABLET | ORAL | 0 refills | Status: DC | PRN

## 2017-01-17 MED ORDER — NAPROXEN 500 MG PO TABS: 500.0000 mg | ORAL_TABLET | Freq: Two times a day (BID) | ORAL | 0 refills | Status: DC

## 2017-01-17 MED ORDER — AMLODIPINE BESYLATE 10 MG PO TABS: 10.0000 mg | ORAL_TABLET | Freq: Every day | ORAL | 0 refills | Status: DC

## 2017-01-17 MED ORDER — CYCLOBENZAPRINE HCL 10 MG PO TABS: 10.0000 mg | ORAL_TABLET | Freq: Three times a day (TID) | ORAL | 0 refills | Status: DC | PRN

## 2017-01-17 MED ORDER — POTASSIUM CHLORIDE CRYS ER 20 MEQ PO TBCR
40.0000 meq | EXTENDED_RELEASE_TABLET | Freq: Once | ORAL | Status: AC
  Administered 2017-01-17: 40 meq via ORAL
  Filled 2017-01-17: qty 2

## 2017-01-17 NOTE — ED Notes (Signed)
Pt taken to CT via wheelchair.

## 2017-01-17 NOTE — ED Notes (Signed)
Dr Goldston in room.  

## 2017-01-17 NOTE — ED Provider Notes (Signed)
[facility] DEPT Provider Note   CSN: [id number] Arrival date & time: 01/17/17  1932     History   Chief Complaint Chief Complaint  Patient presents with  . Shortness of Breath  . Back Pain    HPI Patrick Russell is a 46 y.o. male.  HPI  45 year old male presents with severe left-sided mid back pain under his scapula. This started yesterday but was not this bad. After going to sleep and waking up this morning the pain was much worse. After his wife came home she took him to urgent care who told him he was having an aneurysm and sent him to the ER. He took a muscle relaxer but it did not help. The pain does not radiate. There is no chest pain or abdominal pain. No hematuria or dysuria. Chief complaint says shortness of breath but he states it hurts to breathe but he has no problem with breathing. Pain is currently a 10/10. He was quite hypertensive at urgent care and is here. He has run out of his hypertensive medicines and when he called his PCP they stated they had to see him before giving him a refill. It has been about 3 weeks since he last had blood pressure medicine. No midline back pain, weakness, or numbness in his extremities. Any type of movement makes the pain worse.  Past Medical History:  Diagnosis Date  . Gout    worse with Allopurinol  . Hypertension   . Obesity   . Subclinical hyperthyroidism   . Tinea pedis   . Tobacco use     Patient Active Problem List   Diagnosis Date Noted  . Primary osteoarthritis of knees, bilateral 06/24/2016  . Diabetes mellitus type 2 in obese ([facility]) 11/22/2015  . Gout 11/01/2015  . OSA (obstructive sleep apnea) 10/13/2015  . Low testosterone 05/02/2015  . Hypertension 07/16/2013  . History of tobacco use 07/16/2013  . Insomnia 07/16/2013  . Obesity     No past surgical history on file.     Home Medications    Prior to Admission medications   Medication Sig Start Date End Date Taking? Authorizing Provider  Colchicine 0.6  MG CAPS TAKE ONE CAPSULE BY MOUTH TWICE A DAY AS NEEDED 12/15/16  Yes Jeffery, Chelle, PA-C  Magnesium 250 MG TABS Take 500 mg by mouth daily.   Yes [provider]  Omega-3 Fatty Acids (FISH OIL) 1200 MG CAPS Take 1,200 mg by mouth 3 (three) times daily.   Yes [provider]  vitamin B-12 (CYANOCOBALAMIN) 500 MCG tablet Take 500 mcg by mouth daily.   Yes [provider]  amLODipine (NORVASC) 10 MG tablet Take 1 tablet (10 mg total) by mouth daily. 01/17/17   Sherwood Gambler, MD  cyclobenzaprine (FLEXERIL) 10 MG tablet Take 1 tablet (10 mg total) by mouth 3 (three) times daily as needed for muscle spasms. 01/17/17   Sherwood Gambler, MD  HYDROcodone-acetaminophen (NORCO) 5-325 MG tablet Take 1-2 tablets by mouth every 4 (four) hours as needed. 01/17/17   Sherwood Gambler, MD  losartan-hydrochlorothiazide (HYZAAR) 100-25 MG tablet Take 1 tablet by mouth daily. 01/17/17   Sherwood Gambler, MD  naproxen (NAPROSYN) 500 MG tablet Take 1 tablet (500 mg total) by mouth 2 (two) times daily with a meal. 01/17/17   Sherwood Gambler, MD    Family History Family History  Problem Relation Age of Onset  . Hypertension Mother   . Diabetes Mother   . Cancer Father  prostate  . Diabetes Father   . Hypertension Father   . Diabetes Brother   . Hypertension Brother     Social History Social History  Substance Use Topics  . Smoking status: Former Smoker    Packs/day: 0.50    Types: Cigarettes    Quit date: 07/07/2013  . Smokeless tobacco: Never Used     Comment: Quit 2014  . Alcohol use 0.0 oz/week     Comment: 1 pint gin/week + some beer     Allergies   Patient has no known allergies.   Review of Systems Review of Systems  Constitutional: Negative for fever.  Respiratory: Negative for shortness of breath.   Cardiovascular: Negative for chest pain.  Gastrointestinal: Negative for abdominal pain, constipation, diarrhea, nausea and vomiting.  Genitourinary: Negative for  dysuria and hematuria.  Musculoskeletal: Positive for back pain.  Neurological: Negative for weakness and numbness.  All other systems reviewed and are negative.    Physical Exam Updated Vital Signs BP (!) 151/98 (BP Location: Right Arm)   Pulse 81   Temp 98.2 F (36.8 C) (Oral)   Resp 12   Ht 5\' 9"  (1.753 m)   Wt (!) 145.2 kg (320 lb)   SpO2 95%   BMI 47.26 kg/m   Physical Exam  Constitutional: He is oriented to person, place, and time. He appears well-developed and well-nourished.  Obese, appears in pain  HENT:  Head: Normocephalic and atraumatic.  Right Ear: External ear normal.  Left Ear: External ear normal.  Nose: Nose normal.  Eyes: Right eye exhibits no discharge. Left eye exhibits no discharge.  Neck: Neck supple.  Cardiovascular: Normal rate, regular rhythm and normal heart sounds.   Pulses:      Radial pulses are 2+ on the right side, and 2+ on the left side.       Dorsalis pedis pulses are 2+ on the right side, and 2+ on the left side.  Pulmonary/Chest: Effort normal and breath sounds normal. He exhibits no tenderness.  Abdominal: Soft. There is no tenderness.  Musculoskeletal: He exhibits no edema.       Cervical back: He exhibits no bony tenderness.       Thoracic back: He exhibits tenderness. He exhibits no bony tenderness.       Lumbar back: He exhibits no tenderness and no bony tenderness.       Back:  5/5 strength, normal gross sensation in BLE  Neurological: He is alert and oriented to person, place, and time.  Skin: Skin is warm and dry.  Nursing note and vitals reviewed.    ED Treatments / Results  Labs (all labs ordered are listed, but only abnormal results are displayed) Labs Reviewed  CBC - Abnormal; Notable for the following:       Result Value   RBC 5.90 (*)    All other components within normal limits  COMPREHENSIVE METABOLIC PANEL - Abnormal; Notable for the following:    Sodium 134 (*)    Potassium 3.3 (*)    Chloride 96 (*)     Glucose, Bld 105 (*)    AST 42 (*)    All other components within normal limits  LIPASE, BLOOD  URINALYSIS, ROUTINE W REFLEX MICROSCOPIC  I-STAT TROPONIN, ED  I-STAT TROPONIN, ED    EKG  EKG Interpretation  Date/Time:  Tuesday January 17 2017 19:44:06 EDT Ventricular Rate:  89 PR Interval:  174 QRS Duration: 78 QT Interval:  352 QTC Calculation: 428 R Axis:   -  3 Text Interpretation:  Normal sinus rhythm Low voltage QRS Borderline ECG no significant change compared to 2010 Confirmed by Sherwood Gambler [id number]) on 01/17/2017 8:18:37 PM       Radiology Dg Chest Portable 1 View  Result Date: 01/17/2017 CLINICAL DATA:  Shortness of breath EXAM: PORTABLE CHEST 1 VIEW COMPARISON:  Chest radiograph 01/07/2009 FINDINGS: Unchanged cardiomegaly. No focal consolidation or pulmonary edema. Visualization is poor due to poor penetration secondary to body habitus. IMPRESSION: Cardiomegaly without acute airspace disease. Electronically Signed   By: Ulyses Jarred M.D.   On: 01/17/2017 21:17   Ct Angio Chest/abd/pel For Dissection W And/or Wo Contrast  Result Date: 01/17/2017 CLINICAL DATA:  Back pain radiating into the abdomen for 3 days worsening this morning. Shortness of breath. EXAM: CT ANGIOGRAPHY CHEST, ABDOMEN AND PELVIS TECHNIQUE: Multidetector CT imaging through the chest, abdomen and pelvis was performed using the standard protocol during bolus administration of intravenous contrast. Multiplanar reconstructed images and MIPs were obtained and reviewed to evaluate the vascular anatomy. CONTRAST:  100 cc Isovue 370 COMPARISON:  Chest radiograph from 01/17/2017 FINDINGS: Body habitus reduces diagnostic sensitivity and specificity. CTA CHEST FINDINGS Cardiovascular: No findings of thoracic aortic or branch vessel dissection. No acute intramural hematoma on the noncontrast images. There is mild cardiomegaly along with some faint suspected atherosclerotic calcification in the left anterior descending  and left circumflex coronary arteries. Although today' s exam was not timed for pulmonary arterial opacification, no large or central pulmonary embolus is identified. Mediastinum/Nodes: Small hypodense nodules in the left lobe of the thyroid gland. Lungs/Pleura: Mild atelectasis along the cardiac border of the lingula. No pleural effusion. Musculoskeletal: Thoracic spondylosis. Review of the MIP images confirms the above findings. CTA ABDOMEN AND PELVIS FINDINGS VASCULAR Aorta: Unremarkable.  No dissection. Celiac: Patent and unremarkable. SMA: Unremarkable Renals: Single right and 2 left renal arteries appear widely patent. IMA: Patent. Inflow: Faint atherosclerotic calcification of both common iliac arteries, and of the internal iliac arteries and branches. No significant stenosis. Veins: No appreciable abnormality or asymmetry. Review of the MIP images confirms the above findings. NON-VASCULAR Hepatobiliary: Unremarkable Pancreas: Unremarkable Spleen: Unremarkable Adrenals/Urinary Tract: Unremarkable Stomach/Bowel: There are some scattered diverticula of the sigmoid colon. Appendix normal. Lymphatic: Unremarkable Reproductive: Unremarkable Other: No supplemental non-categorized findings. Musculoskeletal: Lumbar spondylosis and suspected mild lower lumbar degenerative disc disease. Small umbilical hernia contains adipose tissue. Review of the MIP images confirms the above findings. IMPRESSION: 1. No dissection or acute vascular findings. 2. Faint coronary atherosclerosis. Mild atherosclerotic calcification of the common iliac and internal iliac vessels. 3. Thoracolumbar spondylosis. 4. Small umbilical hernia contains adipose tissue. 5. There are a few scattered diverticula of the sigmoid colon which do not appear inflamed. 6. Mild cardiomegaly. Electronically Signed   By: Van Clines M.D.   On: 01/17/2017 21:41    Procedures Procedures (including critical care time)  Medications Ordered in  ED Medications  HYDROmorphone (DILAUDID) injection 1 mg (1 mg Intravenous Given 01/17/17 2039)  iopamidol (ISOVUE-370) 76 % injection (100 mLs  Contrast Given 01/17/17 2112)  potassium chloride SA (K-DUR,KLOR-CON) CR tablet 40 mEq (40 mEq Oral Given 01/17/17 2318)  HYDROmorphone (DILAUDID) injection 1 mg (1 mg Intravenous Given 01/17/17 2318)  ketorolac (TORADOL) 30 MG/ML injection 30 mg (30 mg Intravenous Given 01/17/17 2318)     Initial Impression / Assessment and Plan / ED Course  I have reviewed the triage vital signs and the nursing notes.  Pertinent labs & imaging results that were available during my  care of the patient were reviewed by me and considered in my medical decision making (see chart for details).     CT with dissection protocol was obtained given his significant hypertension and severe back pain. This is unremarkable in the setting of no obvious dissection. There is some mild cardiomegaly, although the clinical significance of this is unclear given he has no chest pain or shortness of breath. He does not appear to have heart failure. I think he needs to go back on his hypertensive medications and probably needs outpatient echo and cardiology follow-up. No urinary symptoms. No obvious intra-abdominal pathology. I think given his progressive pain this is most likely muscular. His obesity limits the evaluation but I do not feel an obvious spasm. However this is likely strain/spasm. He will be treated with NSAIDs, brief course of narcotics, and muscle relaxer. He is already feeling better. Will refer to PCP and cardiology and place back on his antihypertensives. Discussed return precautions.  Final Clinical Impressions(s) / ED Diagnoses   Final diagnoses:  Acute left-sided thoracic back pain  Cardiomegaly  Essential hypertension    New Prescriptions New Prescriptions   CYCLOBENZAPRINE (FLEXERIL) 10 MG TABLET    Take 1 tablet (10 mg total) by mouth 3 (three) times daily as  needed for muscle spasms.   HYDROCODONE-ACETAMINOPHEN (NORCO) 5-325 MG TABLET    Take 1-2 tablets by mouth every 4 (four) hours as needed.   NAPROXEN (NAPROSYN) 500 MG TABLET    Take 1 tablet (500 mg total) by mouth 2 (two) times daily with a meal.     Sherwood Gambler, MD 01/18/17 [phone]

## 2017-01-17 NOTE — ED Notes (Signed)
Dr Regenia Skeeter in room. Pt requested to sit up. Pt placed in wheelchair sitting up. Pt still on monitor. Pt more comfortable now.

## 2017-01-17 NOTE — ED Triage Notes (Signed)
Pt sent to ED from Urgent Care with c/o pain under left scapula radiating around to left upper abd.  Pt st's pain started 2 days ago but became much worse this am.  Describes pain as sharp and causes him to be short of breath.  Pt diaphoretic

## 2017-01-18 NOTE — ED Notes (Signed)
Patient verbalized understanding of discharge instructions and denies any further needs or questions at this time. VS stable. Patient ambulatory with steady gait. Escorted to ED entrance in wheelchair.   

## 2017-02-03 ENCOUNTER — Encounter: Payer: Commercial Managed Care - HMO | Admitting: Physician Assistant

## 2017-02-04 ENCOUNTER — Other Ambulatory Visit: Payer: Self-pay | Admitting: Physician Assistant

## 2017-02-04 DIAGNOSIS — M109 Gout, unspecified: Secondary | ICD-10-CM

## 2017-02-11 ENCOUNTER — Ambulatory Visit (INDEPENDENT_AMBULATORY_CARE_PROVIDER_SITE_OTHER): Payer: 59 | Admitting: Physician Assistant

## 2017-02-11 ENCOUNTER — Encounter: Payer: Self-pay | Admitting: Physician Assistant

## 2017-02-11 VITALS — BP 151/91 | HR 89 | Temp 98.2°F | Resp 16 | Ht 68.0 in | Wt 322.4 lb

## 2017-02-11 DIAGNOSIS — R7989 Other specified abnormal findings of blood chemistry: Secondary | ICD-10-CM | POA: Diagnosis not present

## 2017-02-11 DIAGNOSIS — G8929 Other chronic pain: Secondary | ICD-10-CM | POA: Diagnosis not present

## 2017-02-11 DIAGNOSIS — R7303 Prediabetes: Secondary | ICD-10-CM | POA: Diagnosis not present

## 2017-02-11 DIAGNOSIS — I10 Essential (primary) hypertension: Secondary | ICD-10-CM

## 2017-02-11 DIAGNOSIS — M546 Pain in thoracic spine: Secondary | ICD-10-CM | POA: Diagnosis not present

## 2017-02-11 DIAGNOSIS — E669 Obesity, unspecified: Secondary | ICD-10-CM

## 2017-02-11 DIAGNOSIS — Z23 Encounter for immunization: Secondary | ICD-10-CM | POA: Diagnosis not present

## 2017-02-11 DIAGNOSIS — M1712 Unilateral primary osteoarthritis, left knee: Secondary | ICD-10-CM | POA: Insufficient documentation

## 2017-02-11 DIAGNOSIS — M549 Dorsalgia, unspecified: Secondary | ICD-10-CM | POA: Insufficient documentation

## 2017-02-11 DIAGNOSIS — Z0000 Encounter for general adult medical examination without abnormal findings: Secondary | ICD-10-CM | POA: Diagnosis not present

## 2017-02-11 DIAGNOSIS — R946 Abnormal results of thyroid function studies: Secondary | ICD-10-CM | POA: Diagnosis not present

## 2017-02-11 LAB — POCT URINALYSIS DIP (MANUAL ENTRY)
Bilirubin, UA: NEGATIVE
GLUCOSE UA: NEGATIVE mg/dL
Ketones, POC UA: NEGATIVE mg/dL
Leukocytes, UA: NEGATIVE
Nitrite, UA: NEGATIVE
Protein Ur, POC: NEGATIVE mg/dL
RBC UA: NEGATIVE
SPEC GRAV UA: 1.02 (ref 1.010–1.025)
UROBILINOGEN UA: 0.2 U/dL
pH, UA: 6 (ref 5.0–8.0)

## 2017-02-11 MED ORDER — AMLODIPINE BESYLATE 10 MG PO TABS: 10.0000 mg | ORAL_TABLET | Freq: Every day | ORAL | 3 refills | Status: DC

## 2017-02-11 MED ORDER — OLMESARTAN MEDOXOMIL-HCTZ 40-12.5 MG PO TABS: 1.0000 | ORAL_TABLET | Freq: Every day | ORAL | 3 refills | Status: DC

## 2017-02-11 MED ORDER — NAPROXEN 500 MG PO TABS: 500.0000 mg | ORAL_TABLET | Freq: Two times a day (BID) | ORAL | 0 refills | Status: DC | PRN

## 2017-02-11 MED ORDER — CYCLOBENZAPRINE HCL 10 MG PO TABS: 10.0000 mg | ORAL_TABLET | Freq: Every evening | ORAL | 1 refills | Status: DC | PRN

## 2017-02-11 NOTE — Progress Notes (Signed)
Patient ID: Gordy Gnagey, male    DOB: 1970-12-09, 46 y.o.   MRN: [id number]  PCP: Porfirio Oar, PA-C  Chief Complaint  Patient presents with  . Annual Exam    Subjective:   Presents for [employer].  Out of his BP medications x 1 month. Notes that his BP had been 150/90 or so for months now. He recalls it being well controlled when he took Azor.  Colorectal Cancer Screening: not yet a candidate Prostate Cancer Screening: 10/13/2015, normal (0.66) Bone Density Testing: not yet a candidate HIV Screening: Negative 10/2015 STI Screening: very low risk Seasonal Influenza Vaccination: annually Td/Tdap Vaccination: 2010 Pneumococcal Vaccination: 04/05/2016 Zoster Vaccination: not yet a candidate Frequency of Dental evaluation: infrequently, plans to get implants. Periodontist has removed 6 teeth. Frequency of Eye evaluation: annually, Dr. Hazle Quant     Patient Active Problem List   Diagnosis Date Noted  . Primary osteoarthritis of knees, bilateral 06/24/2016  . Diabetes mellitus type 2 in obese ([facility]) 11/22/2015  . Gout 11/01/2015  . OSA (obstructive sleep apnea) 10/13/2015  . Low testosterone 05/02/2015  . Hypertension 07/16/2013  . History of tobacco use 07/16/2013  . Insomnia 07/16/2013  . Obesity     Past Medical History:  Diagnosis Date  . Gout    worse with Allopurinol  . Hypertension   . Obesity   . Subclinical hyperthyroidism   . Tinea pedis   . Tobacco use      Prior to Admission medications   Medication Sig Start Date End Date Taking? Authorizing Provider  amLODipine (NORVASC) 10 MG tablet Take 1 tablet (10 mg total) by mouth daily. 01/17/17  Yes Pricilla Loveless, MD  Colchicine 0.6 MG CAPS TAKE ONE CAPSULE BY MOUTH TWICE A DAY AS NEEDED 02/10/17  Yes Kyelle Urbas, PA-C  cyclobenzaprine (FLEXERIL) 10 MG tablet Take 1 tablet (10 mg total) by mouth 3 (three) times daily as needed for muscle spasms. 01/17/17  Yes Pricilla Loveless, MD    HYDROcodone-acetaminophen (NORCO) 5-325 MG tablet Take 1-2 tablets by mouth every 4 (four) hours as needed. 01/17/17  Yes Pricilla Loveless, MD  losartan-hydrochlorothiazide (HYZAAR) 100-25 MG tablet Take 1 tablet by mouth daily. 01/17/17  Yes Pricilla Loveless, MD  Magnesium 250 MG TABS Take 500 mg by mouth daily.   Yes [provider]  naproxen (NAPROSYN) 500 MG tablet Take 1 tablet (500 mg total) by mouth 2 (two) times daily with a meal. 01/17/17  Yes Pricilla Loveless, MD  Omega-3 Fatty Acids (FISH OIL) 1200 MG CAPS Take 1,200 mg by mouth 3 (three) times daily.   Yes [provider]  vitamin B-12 (CYANOCOBALAMIN) 500 MCG tablet Take 500 mcg by mouth daily.   Yes [provider]    No Known Allergies  History reviewed. No pertinent surgical history.  Family History  Problem Relation Age of Onset  . Hypertension Mother   . Diabetes Mother        controlled with weight loss  . Cancer Father        prostate  . Diabetes Father        controlled with weight loss  . Hypertension Father   . Diabetes Brother   . Hypertension Brother     Social History   Social History  . Marital status: Married    Spouse name: [name]  . Number of children: 3  . Years of education: college   Occupational History  . [employer]  [place]   Social History Main Topics  . Smoking status: Former Smoker    Packs/day: 0.50    Types: Cigarettes    Quit date: 07/07/2013  . Smokeless tobacco: Never Used     Comment: Quit 2014  . Alcohol use 0.0 oz/week     Comment: weekends only: some beer, a couple of shots  . Drug use: No  . Sexual activity: Yes    Partners: Female   Other Topics Concern  . None   Social History Narrative   Lives with his wife and their daughter, when she is not at school.     His younger son is in college and lives independently in [place], [place]. When he comes "home" he stays at his mother's home.    His older son is married and lives  independently here in [place].       Review of Systems  Constitutional: Negative.   HENT: Negative.   Eyes: Negative.   Respiratory: Positive for cough (only in the mornings, and resolves once he coughs up some mucous). Negative for apnea, choking, chest tightness, shortness of breath, wheezing and stridor.   Cardiovascular: Negative.   Gastrointestinal: Negative.   Endocrine: Positive for polydipsia (feels like he wakes up "dehydrated") and polyuria (rare nocturia).  Genitourinary: Positive for frequency. Negative for decreased urine volume, difficulty urinating, discharge, dysuria, enuresis, flank pain, genital sores, hematuria, penile pain, penile swelling, scrotal swelling, testicular pain and urgency.  Musculoskeletal: Positive for arthralgias (LEFT knee pops, diagnosed as OA by orthopedics, naproxen helped, but he ran out) and back pain (LEFT side, lower thoracic). Negative for gait problem, joint swelling, myalgias, neck pain and neck stiffness.  Skin: Negative.   Allergic/Immunologic: Negative.   Neurological: Negative.   Hematological: Negative.         Objective:  Physical Exam  Constitutional: He is oriented to person, place, and time. He appears well-developed and well-nourished. He is active and cooperative.  Non-toxic appearance. He does not have a sickly appearance. He does not appear ill. No distress.  BP (!) 151/91   Pulse 89   Temp 98.2 F (36.8 C) (Oral)   Resp 16   Ht 5\' 8"  (1.727 m)   Wt (!) 322 lb 6.4 oz (146.2 kg)   SpO2 97%   BMI 49.02 kg/m    HENT:  Head: Normocephalic and atraumatic.  Right Ear: Hearing, tympanic membrane, external ear and ear canal normal.  Left Ear: Hearing, tympanic membrane, external ear and ear canal normal.  Nose: Nose normal.  Mouth/Throat: Uvula is midline, oropharynx is clear and moist and mucous membranes are normal. He does not have dentures. No oral lesions. No trismus in the jaw. Normal dentition. No dental  abscesses, uvula swelling, lacerations or dental caries.  Eyes: Pupils are equal, round, and reactive to light. Conjunctivae, EOM and lids are normal. Right eye exhibits no discharge. Left eye exhibits no discharge. No scleral icterus.  Fundoscopic exam:      The right eye shows no arteriolar narrowing, no AV nicking, no exudate, no hemorrhage and no papilledema.       The left eye shows no arteriolar narrowing, no AV nicking, no exudate, no hemorrhage and no papilledema.  Neck: Normal range of motion, full passive range of motion without pain and phonation normal. Neck supple. No spinous process tenderness and no muscular tenderness present. No neck rigidity. No tracheal deviation, no edema, no erythema and normal range of motion present. No thyromegaly present.  Cardiovascular: Normal  rate, regular rhythm, S1 normal, S2 normal, normal heart sounds, intact distal pulses and normal pulses.  Exam reveals no gallop and no friction rub.   No murmur heard. Pulmonary/Chest: Effort normal and breath sounds normal. No respiratory distress. He has no wheezes. He has no rales.  Abdominal: Soft. Normal appearance and bowel sounds are normal. He exhibits no distension and no mass. There is no hepatosplenomegaly. There is no tenderness. There is no rebound and no guarding. No hernia.  Musculoskeletal: Normal range of motion. He exhibits no edema or tenderness.       Cervical back: Normal. He exhibits normal range of motion, no tenderness, no bony tenderness, no swelling, no edema, no deformity, no laceration, no pain, no spasm and normal pulse.       Thoracic back: Normal.       Lumbar back: Normal.  Lymphadenopathy:       Head (right side): No submental, no submandibular, no tonsillar, no preauricular, no posterior auricular and no occipital adenopathy present.       Head (left side): No submental, no submandibular, no tonsillar, no preauricular, no posterior auricular and no occipital adenopathy present.     He has no cervical adenopathy.       Right: No supraclavicular adenopathy present.       Left: No supraclavicular adenopathy present.  Neurological: He is alert and oriented to person, place, and time. He has normal strength and normal reflexes. He displays no tremor. No cranial nerve deficit. He exhibits normal muscle tone. Coordination and gait normal.  Skin: Skin is warm, dry and intact. No abrasion, no ecchymosis, no laceration, no lesion and no rash noted. He is not diaphoretic. No cyanosis or erythema. No pallor. Nails show no clubbing.  Psychiatric: He has a normal mood and affect. His speech is normal and behavior is normal. Judgment and thought content normal. Cognition and memory are normal.       Assessment & Plan:   Problem List Items Addressed This Visit    Hypertension (Chronic)    Not controlled. STOP losartan-HCTZ. START olmesartan-HCTZ CONTINUE amlodipine.       Relevant Medications   amLODipine (NORVASC) 10 MG tablet   olmesartan-hydrochlorothiazide (BENICAR HCT) 40-12.5 MG tablet   Other Relevant Orders   CBC with Differential/Platelet (Completed)   Comprehensive metabolic panel (Completed)   TSH (Completed)   POCT urinalysis dipstick (Completed)   Obesity    Healthy lifestyle changes encouraged. May benefit from medical weight management.      Relevant Orders   Amb Ref to Medical Weight Management   Low testosterone    Normal on last check. No longer on testosterone. Update level.      Relevant Orders   Testosterone (Completed)   Prediabetes    Encouraged lifestyle changes for weight loss. He may benefit from medical weight management.      Relevant Orders   Comprehensive metabolic panel (Completed)   Lipid panel (Completed)   Hemoglobin A1c (Completed)   Microalbumin, urine (Completed)   Osteoarthritis of left knee    Refill naproxen, previously helpful.      Relevant Medications   naproxen (NAPROSYN) 500 MG tablet   cyclobenzaprine  (FLEXERIL) 10 MG tablet   Back pain    Stable.      Relevant Medications   naproxen (NAPROSYN) 500 MG tablet   cyclobenzaprine (FLEXERIL) 10 MG tablet    Other Visit Diagnoses    Annual physical exam    -  Primary  Age appropriate health guidance provided.   Need for influenza vaccination       Relevant Orders   Flu Vaccine QUAD 36+ mos IM (Completed)   Care order/instruction:       Return in about 6 months (around 08/11/2017) for re-evaluation of blood pressure, etc.   Fernande Bras, PA-C [facility]

## 2017-02-11 NOTE — Patient Instructions (Signed)
IF you received an x-ray today, you will receive an invoice from [facility]. Please contact [facility] at [phone] with questions or concerns regarding your invoice.   IF you received labwork today, you will receive an invoice from [place]. Please contact [facility] at [phone] with questions or concerns regarding your invoice.   Our billing staff will not be able to assist you with questions regarding bills from these companies.  You will be contacted with the lab results as soon as they are available. The fastest way to get your results is to activate your My Chart account. Instructions are located on the last page of this paperwork. If you have not heard from [place] regarding the results in 2 weeks, please contact this office.     Keeping you healthy  Get these tests  Blood pressure- Have your blood pressure checked once a year by your healthcare provider.  Normal blood pressure is 120/80.  Weight- Have your body mass index (BMI) calculated to screen for obesity.  BMI is a measure of body fat based on height and weight. You can also calculate your own BMI at GravelBags.it.  Cholesterol- Have your cholesterol checked regularly starting at age 4, sooner may be necessary if you have diabetes, high blood pressure, if a family member developed heart diseases at an early age or if you smoke.   Chlamydia, HIV, and other sexual transmitted disease- Get screened each year until the age of 69 then within three months of each new sexual partner.  Diabetes- Have your blood sugar checked regularly if you have high blood pressure, high cholesterol, a family history of diabetes or if you are overweight.  Get these vaccines  Flu shot- Every fall.  Tetanus shot- Every 10 years.  Menactra- Single dose; prevents meningitis.  Take these steps  Don't smoke- If you do smoke, ask your healthcare provider about quitting. For tips on how to quit, go to  www.smokefree.gov or call 1-800-QUIT-NOW.  Be physically active- Exercise 5 days a week for at least 30 minutes.  If you are not already physically active start slow and gradually work up to 30 minutes of moderate physical activity.  Examples of moderate activity include walking briskly, mowing the yard, dancing, swimming bicycling, etc.  Eat a healthy diet- Eat a variety of healthy foods such as fruits, vegetables, low fat milk, low fat cheese, yogurt, lean meats, poultry, fish, beans, tofu, etc.  For more information on healthy eating, go to www.thenutritionsource.org  Drink alcohol in moderation- Limit alcohol intake two drinks or less a day.  Never drink and drive.  Dentist- Brush and floss teeth twice daily; visit your dentis twice a year.  Depression-Your emotional health is as important as your physical health.  If you're feeling down, losing interest in things you normally enjoy please talk with your healthcare provider.  Gun Safety- If you keep a gun in your home, keep it unloaded and with the safety lock on.  Bullets should be stored separately.  Helmet use- Always wear a helmet when riding a motorcycle, bicycle, rollerblading or skateboarding.  Safe sex- If you may be exposed to a sexually transmitted infection, use a condom  Seat belts- Seat bels can save your life; always wear one.  Smoke/Carbon Monoxide detectors- These detectors need to be installed on the appropriate level of your home.  Replace batteries at least once a year.  Skin Cancer- When out in the sun, cover up and use sunscreen SPF 34 or  higher.  Violence- If anyone is threatening or hurting you, please tell your healthcare provider.

## 2017-02-12 LAB — COMPREHENSIVE METABOLIC PANEL
ALBUMIN: 4.6 g/dL (ref 3.5–5.5)
ALK PHOS: 67 IU/L (ref 39–117)
ALT: 43 IU/L (ref 0–44)
AST: 46 IU/L — ABNORMAL HIGH (ref 0–40)
Albumin/Globulin Ratio: 1.6 (ref 1.2–2.2)
BUN / CREAT RATIO: 13 (ref 9–20)
BUN: 11 mg/dL (ref 6–24)
Bilirubin Total: 0.7 mg/dL (ref 0.0–1.2)
CALCIUM: 9.6 mg/dL (ref 8.7–10.2)
CO2: 28 mmol/L (ref 20–29)
CREATININE: 0.83 mg/dL (ref 0.76–1.27)
Chloride: 96 mmol/L (ref 96–106)
GFR calc Af Amer: 122 mL/min/{1.73_m2} (ref >59)
GFR, EST NON AFRICAN AMERICAN: 106 mL/min/{1.73_m2} (ref >59)
GLUCOSE: 113 mg/dL — AB (ref 65–99)
Globulin, Total: 2.8 g/dL (ref 1.5–4.5)
Potassium: 3.7 mmol/L (ref 3.5–5.2)
Sodium: 139 mmol/L (ref 134–144)
TOTAL PROTEIN: 7.4 g/dL (ref 6.0–8.5)

## 2017-02-12 LAB — CBC WITH DIFFERENTIAL/PLATELET
BASOS ABS: 0 10*3/uL (ref 0.0–0.2)
Basos: 0 %
EOS (ABSOLUTE): 0.1 10*3/uL (ref 0.0–0.4)
Eos: 2 %
HEMATOCRIT: 46.3 % (ref 37.5–51.0)
HEMOGLOBIN: 15.7 g/dL (ref 13.0–17.7)
IMMATURE GRANS (ABS): 0 10*3/uL (ref 0.0–0.1)
Immature Granulocytes: 0 %
LYMPHS ABS: 1.8 10*3/uL (ref 0.7–3.1)
LYMPHS: 27 %
MCH: 26.9 pg (ref 26.6–33.0)
MCHC: 33.9 g/dL (ref 31.5–35.7)
MCV: 79 fL (ref 79–97)
MONOCYTES: 8 %
Monocytes Absolute: 0.6 10*3/uL (ref 0.1–0.9)
NEUTROS ABS: 4.1 10*3/uL (ref 1.4–7.0)
Neutrophils: 63 %
Platelets: 370 10*3/uL (ref 150–379)
RBC: 5.83 x10E6/uL — ABNORMAL HIGH (ref 4.14–5.80)
RDW: 14.6 % (ref 12.3–15.4)
WBC: 6.7 10*3/uL (ref 3.4–10.8)

## 2017-02-12 LAB — LIPID PANEL
CHOLESTEROL TOTAL: 236 mg/dL — AB (ref 100–199)
Chol/HDL Ratio: 4.4 ratio (ref 0.0–5.0)
HDL: 54 mg/dL (ref >39)
LDL Calculated: 142 mg/dL — ABNORMAL HIGH (ref 0–99)
TRIGLYCERIDES: 202 mg/dL — AB (ref 0–149)
VLDL Cholesterol Cal: 40 mg/dL (ref 5–40)

## 2017-02-12 LAB — HEMOGLOBIN A1C
Est. average glucose Bld gHb Est-mCnc: 131 mg/dL
Hgb A1c MFr Bld: 6.2 % — ABNORMAL HIGH (ref 4.8–5.6)

## 2017-02-12 LAB — MICROALBUMIN, URINE: MICROALBUM., U, RANDOM: 18 ug/mL

## 2017-02-12 LAB — TESTOSTERONE: TESTOSTERONE: 405 ng/dL (ref 264–916)

## 2017-02-12 LAB — TSH: TSH: 0.158 u[IU]/mL — ABNORMAL LOW (ref 0.450–4.500)

## 2017-02-13 ENCOUNTER — Encounter: Payer: Self-pay | Admitting: Physician Assistant

## 2017-02-13 DIAGNOSIS — R7989 Other specified abnormal findings of blood chemistry: Secondary | ICD-10-CM | POA: Insufficient documentation

## 2017-02-13 NOTE — Assessment & Plan Note (Signed)
Not controlled. STOP losartan-HCTZ. START olmesartan-HCTZ CONTINUE amlodipine.

## 2017-02-13 NOTE — Assessment & Plan Note (Signed)
Refill naproxen, previously helpful.

## 2017-02-13 NOTE — Assessment & Plan Note (Signed)
Encouraged lifestyle changes for weight loss. He may benefit from medical weight management.

## 2017-02-13 NOTE — Assessment & Plan Note (Signed)
Stable

## 2017-02-13 NOTE — Assessment & Plan Note (Signed)
Normal on last check. No longer on testosterone. Update level.

## 2017-02-13 NOTE — Assessment & Plan Note (Addendum)
Healthy lifestyle changes encouraged. May benefit from medical weight management.

## 2017-02-16 LAB — T4, FREE: Free T4: 0.98 ng/dL (ref 0.82–1.77)

## 2017-02-16 LAB — SPECIMEN STATUS REPORT

## 2017-02-16 LAB — T3, FREE: T3, Free: 3.5 pg/mL (ref 2.0–4.4)

## 2017-03-17 ENCOUNTER — Other Ambulatory Visit: Payer: Self-pay | Admitting: Physician Assistant

## 2017-03-17 DIAGNOSIS — M109 Gout, unspecified: Secondary | ICD-10-CM

## 2017-04-24 ENCOUNTER — Other Ambulatory Visit: Payer: Self-pay | Admitting: Physician Assistant

## 2017-04-24 DIAGNOSIS — I10 Essential (primary) hypertension: Secondary | ICD-10-CM

## 2017-04-25 ENCOUNTER — Telehealth: Payer: Self-pay | Admitting: Physician Assistant

## 2017-04-25 NOTE — Telephone Encounter (Signed)
Copied from [place] [id number]. Topic: Inquiry >> Apr 25, 2017  4:27 PM Conception Chancy, NT wrote: Reason for CRM: [facility] with [facility] is calling for the pt stating they would like to get a 90 day supply refill for his [place].   [phone]

## 2017-04-28 NOTE — Telephone Encounter (Signed)
Incoming call from patient. Confirmed with patient he is no longer taking the Cozaar and is taking the olmesartan-hydrochlorothiazide. Confirmed medication had been sent to [facility] on [address]. Patient advised he has three refills of medication. He has no further questions. Patient advised to call clinic if any other questions or concerns.

## 2017-04-28 NOTE — Telephone Encounter (Signed)
Phone call to pharmacy, spoke with Vanna, pharmD. [facility] notified patient is no longer taking losartan.   Phone call to patient. Left generic message for patient to return call to clinic regarding medication.

## 2017-05-19 ENCOUNTER — Other Ambulatory Visit: Payer: Self-pay | Admitting: Physician Assistant

## 2017-05-19 DIAGNOSIS — M109 Gout, unspecified: Secondary | ICD-10-CM

## 2017-06-24 ENCOUNTER — Other Ambulatory Visit: Payer: Self-pay | Admitting: Physician Assistant

## 2017-06-24 DIAGNOSIS — M109 Gout, unspecified: Secondary | ICD-10-CM

## 2017-06-26 NOTE — Telephone Encounter (Signed)
Does he need a uric acid done before refilling the Colchicine? Did not see results for one in 2018.

## 2017-07-04 ENCOUNTER — Other Ambulatory Visit: Payer: Self-pay | Admitting: Physician Assistant

## 2017-07-04 DIAGNOSIS — M1712 Unilateral primary osteoarthritis, left knee: Secondary | ICD-10-CM

## 2017-07-04 DIAGNOSIS — G8929 Other chronic pain: Secondary | ICD-10-CM

## 2017-07-04 DIAGNOSIS — M546 Pain in thoracic spine: Secondary | ICD-10-CM

## 2017-07-04 NOTE — Telephone Encounter (Signed)
Please advise:  Naproxen   refill Last OV: 02/11/17 Last CMP was 02/11/17 Last Refill:02/11/17 Pharmacy:  [id number]     [address]

## 2017-07-28 ENCOUNTER — Other Ambulatory Visit: Payer: Self-pay | Admitting: Physician Assistant

## 2017-07-28 DIAGNOSIS — M109 Gout, unspecified: Secondary | ICD-10-CM

## 2017-08-02 ENCOUNTER — Other Ambulatory Visit: Payer: Self-pay | Admitting: Physician Assistant

## 2017-08-02 DIAGNOSIS — M109 Gout, unspecified: Secondary | ICD-10-CM

## 2017-08-07 DIAGNOSIS — M7661 Achilles tendinitis, right leg: Secondary | ICD-10-CM | POA: Diagnosis not present

## 2017-08-30 ENCOUNTER — Other Ambulatory Visit: Payer: Self-pay | Admitting: Physician Assistant

## 2017-08-30 DIAGNOSIS — M109 Gout, unspecified: Secondary | ICD-10-CM

## 2017-09-07 ENCOUNTER — Encounter: Payer: Self-pay | Admitting: Physician Assistant

## 2017-09-07 ENCOUNTER — Encounter: Payer: Self-pay | Admitting: Urgent Care

## 2017-09-07 ENCOUNTER — Other Ambulatory Visit: Payer: Self-pay | Admitting: Physician Assistant

## 2017-09-07 ENCOUNTER — Ambulatory Visit: Payer: 59 | Admitting: Urgent Care

## 2017-09-07 DIAGNOSIS — R030 Elevated blood-pressure reading, without diagnosis of hypertension: Secondary | ICD-10-CM | POA: Diagnosis not present

## 2017-09-07 DIAGNOSIS — M109 Gout, unspecified: Secondary | ICD-10-CM

## 2017-09-07 DIAGNOSIS — I10 Essential (primary) hypertension: Secondary | ICD-10-CM | POA: Diagnosis not present

## 2017-09-07 DIAGNOSIS — R7303 Prediabetes: Secondary | ICD-10-CM | POA: Diagnosis not present

## 2017-09-07 DIAGNOSIS — M2550 Pain in unspecified joint: Secondary | ICD-10-CM | POA: Diagnosis not present

## 2017-09-07 MED ORDER — HYDROCHLOROTHIAZIDE 12.5 MG PO TABS: 12.5000 mg | ORAL_TABLET | Freq: Every day | ORAL | 0 refills | Status: DC

## 2017-09-07 MED ORDER — COLCHICINE 0.6 MG PO CAPS: ORAL_CAPSULE | ORAL | 5 refills | Status: DC

## 2017-09-07 NOTE — Patient Instructions (Addendum)
For gout flares that are not controlled by standard dosing of Mitigare, after 3 tries, you can start taking 1 capsule twice daily.    HYDROCHLOROTHIAZIDE - Start with 1 tablet daily for 2 weeks. Then increase to 2 tablets daily going forward.   For seasoning of your food, you may use salt free (low sodium) seasoning. For instance, Patrick Russell is salt free and has many preparations.     Salads - kale, spinach, cabbage, spring mix; use seeds like pumpkin seeds or sunflower seeds; you can also use 1-2 hard boiled eggs. Fruits - avocadoes, berries (blueberries, raspberries, blackberries), apples, oranges, pomegranate, grapefruit Seeds - quinoa, chia seeds; you can also incorporate oatmeal Vegetables - aspargus, cauliflower, broccoli, green beans, brussel spouts, bell peppers; stay away from starchy vegetables like potatoes, carrots, peas  Brussel sprouts - Cut off stems. Place in a mixing bowl that has a lid. Pour in a 1/4-1/2 cup olive oil, spices, use a light amount of parmesan. Place on a baking sheet. Bake for 10 minutes at 400F. Take it out, eat the brussel chips. Place for another 5-10 minutes.   Mashed cauliflower - Boil a bunch of cauliflower in a pot of water. Blend in a food processor with 1-2 tablespoons of butter.  Spaghetti squash -  Cut the squash in half very carefully, clean out seeds from the middle. Place 1/2 face down in a microwave safe dish with at least 2 inches of water. Make 4-6 slits on outside of spaghetti squash and microwave for 10-12 minutes. Take out the spaghetti using a metal spoon. Repeat for the other half.   Vega protein is good protein powder, make sure you use ~6 ice cubes to give it smoothie consistency together with ~4-6 ounces of vanilla soy milk. Throw cinnamon into your shake, use peanut butter. You can also use the fruits that I listed above. Throw spinach or kale into the shake.     Please make sure that you are eating less or limit your carbohydrates  such as white rice, potatoes, white breads, pasta, pizza, sodas, beer, sweet tea, fruit juices. Exercise can also help with your blood sugar. You can instead eat more salads, fruits, vegetables, whole grains, wheat, oats.     Hypertension Hypertension, commonly called high blood pressure, is when the force of blood pumping through the arteries is too strong. The arteries are the blood vessels that carry blood from the heart throughout the body. Hypertension forces the heart to work harder to pump blood and may cause arteries to become narrow or stiff. Having untreated or uncontrolled hypertension can cause heart attacks, strokes, kidney disease, and other problems. A blood pressure reading consists of a higher number over a lower number. Ideally, your blood pressure should be below 120/80. The first ("top") number is called the systolic pressure. It is a measure of the pressure in your arteries as your heart beats. The second ("bottom") number is called the diastolic pressure. It is a measure of the pressure in your arteries as the heart relaxes. What are the causes? The cause of this condition is not known. What increases the risk? Some risk factors for high blood pressure are under your control. Others are not. Factors you can change  Smoking.  Having type 2 diabetes mellitus, high cholesterol, or both.  Not getting enough exercise or physical activity.  Being overweight.  Having too much fat, sugar, calories, or salt (sodium) in your diet.  Drinking too much alcohol. Factors that are difficult  or impossible to change  Having chronic kidney disease.  Having a family history of high blood pressure.  Age. Risk increases with age.  Race. You may be at higher risk if you are African-American.  Gender. Men are at higher risk than women before age 71. After age 75, women are at higher risk than men.  Having obstructive sleep apnea.  Stress. What are the signs or symptoms? Extremely  high blood pressure (hypertensive crisis) may cause:  Headache.  Anxiety.  Shortness of breath.  Nosebleed.  Nausea and vomiting.  Severe chest pain.  Jerky movements you cannot control (seizures).  How is this diagnosed? This condition is diagnosed by measuring your blood pressure while you are seated, with your arm resting on a surface. The cuff of the blood pressure monitor will be placed directly against the skin of your upper arm at the level of your heart. It should be measured at least twice using the same arm. Certain conditions can cause a difference in blood pressure between your right and left arms. Certain factors can cause blood pressure readings to be lower or higher than normal (elevated) for a short period of time:  When your blood pressure is higher when you are in a health care provider's office than when you are at home, this is called white coat hypertension. Most people with this condition do not need medicines.  When your blood pressure is higher at home than when you are in a health care provider's office, this is called masked hypertension. Most people with this condition may need medicines to control blood pressure.  If you have a high blood pressure reading during one visit or you have normal blood pressure with other risk factors:  You may be asked to return on a different day to have your blood pressure checked again.  You may be asked to monitor your blood pressure at home for 1 week or longer.  If you are diagnosed with hypertension, you may have other blood or imaging tests to help your health care provider understand your overall risk for other conditions. How is this treated? This condition is treated by making healthy lifestyle changes, such as eating healthy foods, exercising more, and reducing your alcohol intake. Your health care provider may prescribe medicine if lifestyle changes are not enough to get your blood pressure under control, and  if:  Your systolic blood pressure is above 130.  Your diastolic blood pressure is above 80.  Your personal target blood pressure may vary depending on your medical conditions, your age, and other factors. Follow these instructions at home: Eating and drinking  Eat a diet that is high in fiber and potassium, and low in sodium, added sugar, and fat. An example eating plan is called the DASH (Dietary Approaches to Stop Hypertension) diet. To eat this way: ? Eat plenty of fresh fruits and vegetables. Try to fill half of your plate at each meal with fruits and vegetables. ? Eat whole grains, such as whole wheat pasta, brown rice, or whole grain bread. Fill about one quarter of your plate with whole grains. ? Eat or drink low-fat dairy products, such as skim milk or low-fat yogurt. ? Avoid fatty cuts of meat, processed or cured meats, and poultry with skin. Fill about one quarter of your plate with lean proteins, such as fish, chicken without skin, beans, eggs, and tofu. ? Avoid premade and processed foods. These tend to be higher in sodium, added sugar, and fat.  Reduce  your daily sodium intake. Most people with hypertension should eat less than 1,500 mg of sodium a day.  Limit alcohol intake to no more than 1 drink a day for nonpregnant women and 2 drinks a day for men. One drink equals 12 oz of beer, 5 oz of wine, or 1 oz of hard liquor. Lifestyle  Work with your health care provider to maintain a healthy body weight or to lose weight. Ask what an ideal weight is for you.  Get at least 30 minutes of exercise that causes your heart to beat faster (aerobic exercise) most days of the week. Activities may include walking, swimming, or biking.  Include exercise to strengthen your muscles (resistance exercise), such as pilates or lifting weights, as part of your weekly exercise routine. Try to do these types of exercises for 30 minutes at least 3 days a week.  Do not use any products that contain  nicotine or tobacco, such as cigarettes and e-cigarettes. If you need help quitting, ask your health care provider.  Monitor your blood pressure at home as told by your health care provider.  Keep all follow-up visits as told by your health care provider. This is important. Medicines  Take over-the-counter and prescription medicines only as told by your health care provider. Follow directions carefully. Blood pressure medicines must be taken as prescribed.  Do not skip doses of blood pressure medicine. Doing this puts you at risk for problems and can make the medicine less effective.  Ask your health care provider about side effects or reactions to medicines that you should watch for. Contact a health care provider if:  You think you are having a reaction to a medicine you are taking.  You have headaches that keep coming back (recurring).  You feel dizzy.  You have swelling in your ankles.  You have trouble with your vision. Get help right away if:  You develop a severe headache or confusion.  You have unusual weakness or numbness.  You feel faint.  You have severe pain in your chest or abdomen.  You vomit repeatedly.  You have trouble breathing. Summary  Hypertension is when the force of blood pumping through your arteries is too strong. If this condition is not controlled, it may put you at risk for serious complications.  Your personal target blood pressure may vary depending on your medical conditions, your age, and other factors. For most people, a normal blood pressure is less than 120/80.  Hypertension is treated with lifestyle changes, medicines, or a combination of both. Lifestyle changes include weight loss, eating a healthy, low-sodium diet, exercising more, and limiting alcohol. This information is not intended to replace advice given to you by your health care provider. Make sure you discuss any questions you have with your health care provider. Document  Released: 05/23/2005 Document Revised: 04/20/2016 Document Reviewed: 04/20/2016 Elsevier Interactive Patient Education  2018 [employer].     Gout Gout is painful swelling that can occur in some of your joints. Gout is a type of arthritis. This condition is caused by having too much uric acid in your body. Uric acid is a chemical that forms when your body breaks down substances called purines. Purines are important for building body proteins. When your body has too much uric acid, sharp crystals can form and build up inside your joints. This causes pain and swelling. Gout attacks can happen quickly and be very painful (acute gout). Over time, the attacks can affect more joints and  become more frequent (chronic gout). Gout can also cause uric acid to build up under your skin and inside your kidneys. What are the causes? This condition is caused by too much uric acid in your blood. This can occur because:  Your kidneys do not remove enough uric acid from your blood. This is the most common cause.  Your body makes too much uric acid. This can occur with some cancers and cancer treatments. It can also occur if your body is breaking down too many red blood cells (hemolytic anemia).  You eat too many foods that are high in purines. These foods include organ meats and some seafood. Alcohol, especially beer, is also high in purines.  A gout attack may be triggered by trauma or stress. What increases the risk? This condition is more likely to develop in people who:  Have a family history of gout.  Are male and middle-aged.  Are male and have gone through menopause.  Are obese.  Frequently drink alcohol, especially beer.  Are dehydrated.  Lose weight too quickly.  Have an organ transplant.  Have lead poisoning.  Take certain medicines, including aspirin, cyclosporine, diuretics, levodopa, and niacin.  Have kidney disease or psoriasis.  What are the signs or symptoms? An attack  of acute gout happens quickly. It usually occurs in just one joint. The most common place is the big toe. Attacks often start at night. Other joints that may be affected include joints of the feet, ankle, knee, fingers, wrist, or elbow. Symptoms may include:  Severe pain.  Warmth.  Swelling.  Stiffness.  Tenderness. The affected joint may be very painful to touch.  Shiny, red, or purple skin.  Chills and fever.  Chronic gout may cause symptoms more frequently. More joints may be involved. You may also have white or yellow lumps (tophi) on your hands or feet or in other areas near your joints. How is this diagnosed? This condition is diagnosed based on your symptoms, medical history, and physical exam. You may have tests, such as:  Blood tests to measure uric acid levels.  Removal of joint fluid with a needle (aspiration) to look for uric acid crystals.  X-rays to look for joint damage.  How is this treated? Treatment for this condition has two phases: treating an acute attack and preventing future attacks. Acute gout treatment may include medicines to reduce pain and swelling, including:  NSAIDs.  Steroids. These are strong anti-inflammatory medicines that can be taken by mouth (orally) or injected into a joint.  Colchicine. This medicine relieves pain and swelling when it is taken soon after an attack. It can be given orally or through an IV tube.  Preventive treatment may include:  Daily use of smaller doses of NSAIDs or colchicine.  Use of a medicine that reduces uric acid levels in your blood.  Changes to your diet. You may need to see a specialist about healthy eating (dietitian).  Follow these instructions at home: During a Gout Attack  If directed, apply ice to the affected area: ? Put ice in a plastic bag. ? Place a towel between your skin and the bag. ? Leave the ice on for 20 minutes, 2-3 times a day.  Rest the joint as much as possible. If the affected  joint is in your leg, you may be given crutches to use.  Raise (elevate) the affected joint above the level of your heart as often as possible.  Drink enough fluids to keep your urine clear  or pale yellow.  Take over-the-counter and prescription medicines only as told by your health care provider.  Do not drive or operate heavy machinery while taking prescription pain medicine.  Follow instructions from your health care provider about eating or drinking restrictions.  Return to your normal activities as told by your health care provider. Ask your health care provider what activities are safe for you. Avoiding Future Gout Attacks  Follow a low-purine diet as told by your dietitian or health care provider. Avoid foods and drinks that are high in purines, including liver, kidney, anchovies, asparagus, herring, mushrooms, mussels, and beer.  Limit alcohol intake to no more than 1 drink a day for nonpregnant women and 2 drinks a day for men. One drink equals 12 oz of beer, 5 oz of wine, or 1 oz of hard liquor.  Maintain a healthy weight or lose weight if you are overweight. If you want to lose weight, talk with your health care provider. It is important that you do not lose weight too quickly.  Start or maintain an exercise program as told by your health care provider.  Drink enough fluids to keep your urine clear or pale yellow.  Take over-the-counter and prescription medicines only as told by your health care provider.  Keep all follow-up visits as told by your health care provider. This is important. Contact a health care provider if:  You have another gout attack.  You continue to have symptoms of a gout attack after10 days of treatment.  You have side effects from your medicines.  You have chills or a fever.  You have burning pain when you urinate.  You have pain in your lower back or belly. Get help right away if:  You have severe or uncontrolled pain.  You cannot  urinate. This information is not intended to replace advice given to you by your health care provider. Make sure you discuss any questions you have with your health care provider. Document Released: 05/20/2000 Document Revised: 10/29/2015 Document Reviewed: 03/05/2015 Elsevier Interactive Patient Education  2018 [employer].     IF you received an x-ray today, you will receive an invoice from [facility]. Please contact [facility] at [phone] with questions or concerns regarding your invoice.   IF you received labwork today, you will receive an invoice from [place]. Please contact [facility] at [phone] with questions or concerns regarding your invoice.   Our billing staff will not be able to assist you with questions regarding bills from these companies.  You will be contacted with the lab results as soon as they are available. The fastest way to get your results is to activate your My Chart account. Instructions are located on the last page of this paperwork. If you have not heard from [place] regarding the results in 2 weeks, please contact this office.

## 2017-09-07 NOTE — Progress Notes (Signed)
MRN: [id number] DOB: September 30, 1970  Subjective:   Patrick Russell is a 47 y.o. male presenting for refill of his gout medication, Mitigare. Denies active gout flare but had one last week over his right ankle. Regarding his HTN, patient denies dizziness, chronic headache, blurred vision, chest pain, shortness of breath, heart racing, palpitations, nausea, vomiting, abdominal pain, hematuria, lower leg swelling. Denies smoking cigarettes. Has 2-3 drinks of alcohol per week. Patient does not exercise. Diet is unhealthy.  Milas has a current medication list which includes the following prescription(s): amlodipine, magnesium, fish oil, vitamin b-12, mitigare, and olmesartan-hydrochlorothiazide. Also has No Known Allergies.  Verble  has a past medical history of Gout, Hypertension, Obesity, Subclinical hyperthyroidism, Tinea pedis, and Tobacco use. Denies past surgical history.   Objective:   Vitals: BP (!) 159/98   Pulse 77   Temp 98 F (36.7 C) (Oral)   Resp 18   Ht 5\' 8"  (1.727 m)   Wt (!) 325 lb (147.4 kg)   SpO2 100%   BMI 49.42 kg/m   BP Readings from Last 3 Encounters:  09/07/17 (!) 159/98  02/11/17 (!) 151/91  01/17/17 (!) 151/98    Wt Readings from Last 3 Encounters:  09/07/17 (!) 325 lb (147.4 kg)  02/11/17 (!) 322 lb 6.4 oz (146.2 kg)  01/17/17 (!) 320 lb (145.2 kg)    The 10-year ASCVD risk score Denman Jaicion DC Jr., et al., 2013) is: 20.8%   Values used to calculate the score:     Age: 3 years     Sex: Male     Is Non-Hispanic African American: Yes     Diabetic: Yes     Tobacco smoker: No     Systolic Blood Pressure: 159 mmHg     Is BP treated: Yes     HDL Cholesterol: 54 mg/dL     Total Cholesterol: 236 mg/dL  Physical Exam  Constitutional: He is oriented to person, place, and time. He appears well-developed and well-nourished.  Body habitus is morbidly obese.  HENT:  Mouth/Throat: Oropharynx is clear and moist.  Eyes: Right eye exhibits no discharge. Left eye  exhibits no discharge. No scleral icterus.  Neck: Normal range of motion. Neck supple. No thyromegaly present.  Cardiovascular: Normal rate, regular rhythm and intact distal pulses. Exam reveals no gallop and no friction rub.  No murmur heard. Pulmonary/Chest: No stridor. No respiratory distress. He has no wheezes. He has no rales.  Abdominal: Soft. Bowel sounds are normal. He exhibits no distension and no mass. There is no tenderness. There is no rebound and no guarding.  Musculoskeletal: He exhibits edema (trace right ankle swelling with slight warmth).  Neurological: He is alert and oriented to person, place, and time.  Skin: Skin is warm and dry.  Psychiatric: He has a normal mood and affect.   Assessment and Plan :   Morbid obesity (HCC) - Plan: Comprehensive metabolic panel  Essential hypertension - Plan: Comprehensive metabolic panel  Elevated blood pressure reading  Gout without tophus - Plan: Uric acid, Colchicine (MITIGARE) 0.6 MG CAPS  Multiple joint pain - Plan: Uric acid, Sedimentation rate  Pre-diabetes - Plan: Hemoglobin A1c  Refilled Mitigare for patient. Counseled on gout and avoiding alcohol, gout causing foods. Discussed lifestyle modifications for his HTN. Will have patient start HCTZ and titrate up to 25mg . Maintain amlodipine. Recheck in 4 weeks.   Wallis Bamberg, PA-C Primary Care at [facility] [phone] 09/07/2017  4:25 PM

## 2017-09-08 ENCOUNTER — Encounter: Payer: Self-pay | Admitting: Urgent Care

## 2017-09-08 LAB — COMPREHENSIVE METABOLIC PANEL
ALBUMIN: 4.7 g/dL (ref 3.5–5.5)
ALK PHOS: 61 IU/L (ref 39–117)
ALT: 30 IU/L (ref 0–44)
AST: 30 IU/L (ref 0–40)
Albumin/Globulin Ratio: 2 (ref 1.2–2.2)
BUN / CREAT RATIO: 18 (ref 9–20)
BUN: 14 mg/dL (ref 6–24)
Bilirubin Total: 0.4 mg/dL (ref 0.0–1.2)
CO2: 26 mmol/L (ref 20–29)
CREATININE: 0.8 mg/dL (ref 0.76–1.27)
Calcium: 9.4 mg/dL (ref 8.7–10.2)
Chloride: 97 mmol/L (ref 96–106)
GFR, EST AFRICAN AMERICAN: 123 mL/min/{1.73_m2} (ref >59)
GFR, EST NON AFRICAN AMERICAN: 106 mL/min/{1.73_m2} (ref >59)
GLOBULIN, TOTAL: 2.4 g/dL (ref 1.5–4.5)
GLUCOSE: 99 mg/dL (ref 65–99)
Potassium: 3.5 mmol/L (ref 3.5–5.2)
SODIUM: 139 mmol/L (ref 134–144)
TOTAL PROTEIN: 7.1 g/dL (ref 6.0–8.5)

## 2017-09-08 LAB — URIC ACID: Uric Acid: 9 mg/dL — ABNORMAL HIGH (ref 3.7–8.6)

## 2017-09-08 LAB — HEMOGLOBIN A1C
Est. average glucose Bld gHb Est-mCnc: 126 mg/dL
HEMOGLOBIN A1C: 6 % — AB (ref 4.8–5.6)

## 2017-09-08 LAB — SEDIMENTATION RATE: Sed Rate: 14 mm/hr (ref 0–15)

## 2017-11-16 ENCOUNTER — Ambulatory Visit: Payer: Self-pay | Admitting: Urgent Care

## 2017-11-16 NOTE — Telephone Encounter (Signed)
Called in c/o having intermittent nosebleeds for the past 2 weeks.   His BP has also been elevated.  See triage notes below.  I made in an appt with Jaynee Eagles, PA for 11/17/17 at 9:40. Reason for Disposition . [1] Bleeding recurs 3 or more times in 24 hours AND [2] direct pressure applied correctly  Answer Assessment - Initial Assessment Questions 1. AMOUNT OF BLEEDING: "How bad is the bleeding?" "How much blood was lost?" "Has the bleeding stopped?"   - MILD: needed a couple tissues   - MODERATE: needed many tissues   - SEVERE: large blood clots, soaked many tissues, lasted more than 30 minutes      Last night it started after work.   It bled for 10 minutes yesterday afternoon.   During night started bleeding again for 5 minutes.   I put cold water on my face and it stopped.    Wife took my BP after that it was 130/109 approximately. 2. ONSET: "When did the nosebleed start?"      Been bleeding for 2 weeks now off and on. 3. FREQUENCY: "How many nosebleeds have you had in the last 24 hours?"      See above. 4. RECURRENT SYMPTOMS: "Have there been other recent nosebleeds?" If so, ask: "How long did it take you to stop the bleeding?" "What worked best?"      See above.    5. CAUSE: "What do you think caused this nosebleed?"     My BP has been high.   I've been under a lot of stress at my job.   I'm taking my BP medicine every morning.     6. LOCAL FACTORS: "Do you have any cold symptoms?", "Have you been rubbing or picking at your nose?"     No 7. SYSTEMIC FACTORS: "Do you have high blood pressure or any bleeding problems?"     Yes take medication. 8. BLOOD THINNERS: "Do you take any blood thinners?" (e.g., coumadin, heparin, aspirin, Plavix)     No 9. OTHER SYMPTOMS: "Do you have any other symptoms?" (e.g., lightheadedness)     No   I'm changing jobs so I have to go have a drug test. 10. PREGNANCY: "Is there any chance you are pregnant?" "When was your last menstrual period?"        N/A  Protocols used: [id number]

## 2017-11-17 ENCOUNTER — Encounter: Payer: Self-pay | Admitting: Urgent Care

## 2017-11-17 ENCOUNTER — Ambulatory Visit: Payer: 59 | Admitting: Urgent Care

## 2017-11-17 VITALS — BP 152/95 | HR 77 | Temp 97.6°F | Resp 17 | Ht 68.0 in | Wt 327.0 lb

## 2017-11-17 DIAGNOSIS — R030 Elevated blood-pressure reading, without diagnosis of hypertension: Secondary | ICD-10-CM

## 2017-11-17 DIAGNOSIS — R7303 Prediabetes: Secondary | ICD-10-CM | POA: Diagnosis not present

## 2017-11-17 DIAGNOSIS — M109 Gout, unspecified: Secondary | ICD-10-CM | POA: Diagnosis not present

## 2017-11-17 DIAGNOSIS — E669 Obesity, unspecified: Secondary | ICD-10-CM | POA: Diagnosis not present

## 2017-11-17 DIAGNOSIS — G4733 Obstructive sleep apnea (adult) (pediatric): Secondary | ICD-10-CM

## 2017-11-17 DIAGNOSIS — I10 Essential (primary) hypertension: Secondary | ICD-10-CM | POA: Diagnosis not present

## 2017-11-17 DIAGNOSIS — R040 Epistaxis: Secondary | ICD-10-CM

## 2017-11-17 MED ORDER — LISINOPRIL 20 MG PO TABS: 20.0000 mg | ORAL_TABLET | Freq: Every day | ORAL | 3 refills | Status: DC

## 2017-11-17 NOTE — Patient Instructions (Addendum)
Hypertension Hypertension, commonly called high blood pressure, is when the force of blood pumping through the arteries is too strong. The arteries are the blood vessels that carry blood from the heart throughout the body. Hypertension forces the heart to work harder to pump blood and may cause arteries to become narrow or stiff. Having untreated or uncontrolled hypertension can cause heart attacks, strokes, kidney disease, and other problems. A blood pressure reading consists of a higher number over a lower number. Ideally, your blood pressure should be below 120/80. The first ("top") number is called the systolic pressure. It is a measure of the pressure in your arteries as your heart beats. The second ("bottom") number is called the diastolic pressure. It is a measure of the pressure in your arteries as the heart relaxes. What are the causes? The cause of this condition is not known. What increases the risk? Some risk factors for high blood pressure are under your control. Others are not. Factors you can change  Smoking.  Having type 2 diabetes mellitus, high cholesterol, or both.  Not getting enough exercise or physical activity.  Being overweight.  Having too much fat, sugar, calories, or salt (sodium) in your diet.  Drinking too much alcohol. Factors that are difficult or impossible to change  Having chronic kidney disease.  Having a family history of high blood pressure.  Age. Risk increases with age.  Race. You may be at higher risk if you are African-American.  Gender. Men are at higher risk than women before age 45. After age 65, women are at higher risk than men.  Having obstructive sleep apnea.  Stress. What are the signs or symptoms? Extremely high blood pressure (hypertensive crisis) may cause:  Headache.  Anxiety.  Shortness of breath.  Nosebleed.  Nausea and vomiting.  Severe chest pain.  Jerky movements you cannot control (seizures).  How is this  diagnosed? This condition is diagnosed by measuring your blood pressure while you are seated, with your arm resting on a surface. The cuff of the blood pressure monitor will be placed directly against the skin of your upper arm at the level of your heart. It should be measured at least twice using the same arm. Certain conditions can cause a difference in blood pressure between your right and left arms. Certain factors can cause blood pressure readings to be lower or higher than normal (elevated) for a short period of time:  When your blood pressure is higher when you are in a health care provider's office than when you are at home, this is called white coat hypertension. Most people with this condition do not need medicines.  When your blood pressure is higher at home than when you are in a health care provider's office, this is called masked hypertension. Most people with this condition may need medicines to control blood pressure.  If you have a high blood pressure reading during one visit or you have normal blood pressure with other risk factors:  You may be asked to return on a different day to have your blood pressure checked again.  You may be asked to monitor your blood pressure at home for 1 week or longer.  If you are diagnosed with hypertension, you may have other blood or imaging tests to help your health care provider understand your overall risk for other conditions. How is this treated? This condition is treated by making healthy lifestyle changes, such as eating healthy foods, exercising more, and reducing your alcohol intake. Your   health care provider may prescribe medicine if lifestyle changes are not enough to get your blood pressure under control, and if:  Your systolic blood pressure is above 130.  Your diastolic blood pressure is above 80.  Your personal target blood pressure may vary depending on your medical conditions, your age, and other factors. Follow these  instructions at home: Eating and drinking  Eat a diet that is high in fiber and potassium, and low in sodium, added sugar, and fat. An example eating plan is called the DASH (Dietary Approaches to Stop Hypertension) diet. To eat this way: ? Eat plenty of fresh fruits and vegetables. Try to fill half of your plate at each meal with fruits and vegetables. ? Eat whole grains, such as whole wheat pasta, brown rice, or whole grain bread. Fill about one quarter of your plate with whole grains. ? Eat or drink low-fat dairy products, such as skim milk or low-fat yogurt. ? Avoid fatty cuts of meat, processed or cured meats, and poultry with skin. Fill about one quarter of your plate with lean proteins, such as fish, chicken without skin, beans, eggs, and tofu. ? Avoid premade and processed foods. These tend to be higher in sodium, added sugar, and fat.  Reduce your daily sodium intake. Most people with hypertension should eat less than 1,500 mg of sodium a day.  Limit alcohol intake to no more than 1 drink a day for nonpregnant women and 2 drinks a day for men. One drink equals 12 oz of beer, 5 oz of wine, or 1 oz of hard liquor. Lifestyle  Work with your health care provider to maintain a healthy body weight or to lose weight. Ask what an ideal weight is for you.  Get at least 30 minutes of exercise that causes your heart to beat faster (aerobic exercise) most days of the week. Activities may include walking, swimming, or biking.  Include exercise to strengthen your muscles (resistance exercise), such as pilates or lifting weights, as part of your weekly exercise routine. Try to do these types of exercises for 30 minutes at least 3 days a week.  Do not use any products that contain nicotine or tobacco, such as cigarettes and e-cigarettes. If you need help quitting, ask your health care provider.  Monitor your blood pressure at home as told by your health care provider.  Keep all follow-up visits as  told by your health care provider. This is important. Medicines  Take over-the-counter and prescription medicines only as told by your health care provider. Follow directions carefully. Blood pressure medicines must be taken as prescribed.  Do not skip doses of blood pressure medicine. Doing this puts you at risk for problems and can make the medicine less effective.  Ask your health care provider about side effects or reactions to medicines that you should watch for. Contact a health care provider if:  You think you are having a reaction to a medicine you are taking.  You have headaches that keep coming back (recurring).  You feel dizzy.  You have swelling in your ankles.  You have trouble with your vision. Get help right away if:  You develop a severe headache or confusion.  You have unusual weakness or numbness.  You feel faint.  You have severe pain in your chest or abdomen.  You vomit repeatedly.  You have trouble breathing. Summary  Hypertension is when the force of blood pumping through your arteries is too strong. If this condition is not   controlled, it may put you at risk for serious complications.  Your personal target blood pressure may vary depending on your medical conditions, your age, and other factors. For most people, a normal blood pressure is less than 120/80.  Hypertension is treated with lifestyle changes, medicines, or a combination of both. Lifestyle changes include weight loss, eating a healthy, low-sodium diet, exercising more, and limiting alcohol. This information is not intended to replace advice given to you by your health care provider. Make sure you discuss any questions you have with your health care provider. Document Released: 05/23/2005 Document Revised: 04/20/2016 Document Reviewed: 04/20/2016 Elsevier Interactive Patient Education  2018 Elsevier Inc.     IF you received an x-ray today, you will receive an invoice from [facility]. Please contact [facility] at [phone] with questions or concerns regarding your invoice.   IF you received labwork today, you will receive an invoice from [facility]. Please contact [facility] at [phone] with questions or concerns regarding your invoice.   Our billing staff will not be able to assist you with questions regarding bills from these companies.  You will be contacted with the lab results as soon as they are available. The fastest way to get your results is to activate your My Chart account. Instructions are located on the last page of this paperwork. If you have not heard from us regarding the results in 2 weeks, please contact this office.     

## 2017-11-17 NOTE — Progress Notes (Signed)
MRN: [id number] DOB: [birth year]  Subjective:   Patrick Russell is a 47 y.o. male presenting for follow up on Hypertension. Currently managed with amlodipine at 10 mg, hydrochlorothiazide at 12.5 mg. Patient is checking blood pressure at home, generally 140s to 180s systolic.     Admits noncompliance with his diet, eats a lot of frozen meals and eats at restaurants occasionally, does not exercise.  Reports persistent nosebleeds daily for the past 1 to 2 weeks, started out having nosebleeds once daily and is not having them twice daily.  He is not using his CPAP machine, has a diagnosis of sleep apnea but can barely recall during the sleep study at all.  Denies chest pain, shortness of breath, heart racing, palpitations, nausea, vomiting, abdominal pain, hematuria, lower leg swelling. Denies smoking cigarettes.  His last episode of gout was about a month ago, resolved quickly.  Patrick Russell has a current medication list which includes the following prescription(s): amlodipine, amlodipine, colchicine, hydrochlorothiazide, magnesium, fish oil, and vitamin b-12. Also has No Known Allergies.  Patrick Russell  has a past medical history of Gout, Hypertension, Obesity, Subclinical hyperthyroidism, Tinea pedis, and Tobacco use. Denies past surgical history.   Objective:   Vitals: BP (!) 152/95   Pulse 77   Temp 97.6 F (36.4 C) (Oral)   Resp 17   Ht 5\' 8"  (1.727 m)   Wt (!) 327 lb (148.3 kg)   SpO2 98%   BMI 49.72 kg/m   BP Readings from Last 3 Encounters:  11/17/17 (!) 152/95  09/07/17 (!) 159/98  02/11/17 (!) 151/91    Physical Exam  Constitutional: He is oriented to person, place, and time. He appears well-developed and well-nourished.  HENT:  Mouth/Throat: Oropharynx is clear and moist.  Eyes: Pupils are equal, round, and reactive to light. EOM are normal. Right eye exhibits no discharge. Left eye exhibits no discharge. No scleral icterus.  Cardiovascular: Normal rate, regular rhythm and intact distal  pulses. Exam reveals no gallop and no friction rub.  No murmur heard. Pulmonary/Chest: Effort normal and breath sounds normal. No respiratory distress. He has no wheezes. He has no rales.  Musculoskeletal: He exhibits edema (trace bilaterally up to mid calves).  Neurological: He is alert and oriented to person, place, and time.  Skin: Skin is warm and dry.  Psychiatric: He has a normal mood and affect.   Assessment and Plan :   Essential hypertension - Plan: Lipid panel  Elevated blood pressure reading  Gout without tophus - Plan: Uric Acid  Prediabetes  Obesity with serious comorbidity, unspecified classification, unspecified obesity type - Plan: Lipid panel  Epistaxis - Plan: Ambulatory referral to ENT  OSA (obstructive sleep apnea) - Plan: Ambulatory referral to Sleep Studies  Due to his history of gout, will stop hydrochlorothiazide and add lisinopril at 20 mg.  Patient admits that he has previously taken an ARB with amlodipine and did very well with his blood pressure.  I will refer to ENT for consult and consideration of cautery for his nosebleeds.  Referral back to sleep clinic for titration of CPAP.  Counseled on dietary modifications.  Follow-up in 4 weeks, we will recheck his hypertension then and do a complete physical.  Wallis Bamberg, PA-C Primary Care at [facility] [phone] 11/17/2017  9:34 AM

## 2017-11-18 ENCOUNTER — Other Ambulatory Visit: Payer: Self-pay | Admitting: Urgent Care

## 2017-11-18 LAB — LIPID PANEL
Chol/HDL Ratio: 4.2 ratio (ref 0.0–5.0)
Cholesterol, Total: 216 mg/dL — ABNORMAL HIGH (ref 100–199)
HDL: 52 mg/dL (ref >39)
LDL CALC: 112 mg/dL — AB (ref 0–99)
TRIGLYCERIDES: 261 mg/dL — AB (ref 0–149)
VLDL Cholesterol Cal: 52 mg/dL — ABNORMAL HIGH (ref 5–40)

## 2017-11-18 LAB — URIC ACID: Uric Acid: 9.9 mg/dL — ABNORMAL HIGH (ref 3.7–8.6)

## 2017-11-18 MED ORDER — ATORVASTATIN CALCIUM 40 MG PO TABS: 40.0000 mg | ORAL_TABLET | Freq: Every day | ORAL | 3 refills | Status: DC

## 2017-11-28 ENCOUNTER — Other Ambulatory Visit: Payer: Self-pay | Admitting: Urgent Care

## 2017-12-05 ENCOUNTER — Ambulatory Visit (INDEPENDENT_AMBULATORY_CARE_PROVIDER_SITE_OTHER): Payer: 59

## 2017-12-05 ENCOUNTER — Ambulatory Visit: Payer: 59 | Admitting: Podiatry

## 2017-12-05 ENCOUNTER — Encounter: Payer: Self-pay | Admitting: Podiatry

## 2017-12-05 VITALS — BP 159/99 | HR 83

## 2017-12-05 DIAGNOSIS — M2141 Flat foot [pes planus] (acquired), right foot: Secondary | ICD-10-CM

## 2017-12-05 DIAGNOSIS — M722 Plantar fascial fibromatosis: Secondary | ICD-10-CM

## 2017-12-05 DIAGNOSIS — M2142 Flat foot [pes planus] (acquired), left foot: Secondary | ICD-10-CM

## 2017-12-05 DIAGNOSIS — M109 Gout, unspecified: Secondary | ICD-10-CM

## 2017-12-05 MED ORDER — MELOXICAM 15 MG PO TABS: 15.0000 mg | ORAL_TABLET | Freq: Every day | ORAL | 0 refills | Status: DC

## 2017-12-05 NOTE — Progress Notes (Signed)
This patient presents the office stating he's having severe pain noted upon standing and walking on his left foot. He says that her pain is through the center of the arch of the left foot.   He says this is extremely painful and has difficulty bearing weight.  He denies any history of trauma or injury to the left foot.  This patient weighs 320 pounds and has a history of gout.  He also says that he takes Mobic 15 mg daily. He presents the office today for an evaluation of his left foot.  General Appearance  Alert, conversant and in no acute stress.  Vascular  Dorsalis pedis and posterior tibial  pulses are palpable  bilaterally.  Capillary return is within normal limits  bilaterally. Temperature is within normal limits  bilaterally.  Neurologic  Senn-Weinstein monofilament wire test within normal limits  bilaterally. Muscle power within normal limits bilaterally.  Nails Normal nails noted both feet with no evidence of bacterial or fungal infection.  Orthopedic  No limitations of motion of motion feet .  No crepitus or effusions noted.  HAV  1st MPJ  B/L  Congenital pes planus.  Palpable pain in centewr of plantar fascia.  No pain at the insertion plantar fascia left foot.  Skin  normotropic skin with no porokeratosis noted bilaterally.  No signs of infections or ulcers noted.    Acute plantar fasciitis  Congenital pes p;lanus.    IE  Xrays reveal dorsomedial exostosis 1st MPJ  B/L  Foot examination revealed pain  along the  the plantar fascia extending into the toes from the mid arch.  Has planus bilaterally.  I recommended that this patient be treated with a plantar fascial brace and a cam walker.  He came to me  stating that the patient  requests the patient  Would like to proceed with a cortisone injection for his pain in addition to the above treatments.  Prescribed Mobic 15 mg daily.  RTC 10 days.   Gardiner Barefoot DPM

## 2017-12-06 ENCOUNTER — Other Ambulatory Visit: Payer: Self-pay | Admitting: Podiatry

## 2017-12-06 DIAGNOSIS — M722 Plantar fascial fibromatosis: Secondary | ICD-10-CM

## 2017-12-15 ENCOUNTER — Encounter: Payer: 59 | Admitting: Urgent Care

## 2017-12-20 ENCOUNTER — Ambulatory Visit: Payer: 59 | Admitting: Podiatry

## 2018-01-10 ENCOUNTER — Ambulatory Visit (INDEPENDENT_AMBULATORY_CARE_PROVIDER_SITE_OTHER): Payer: 59 | Admitting: Urgent Care

## 2018-01-10 ENCOUNTER — Other Ambulatory Visit: Payer: Self-pay

## 2018-01-10 ENCOUNTER — Encounter: Payer: Self-pay | Admitting: Urgent Care

## 2018-01-10 VITALS — BP 150/99 | HR 65 | Temp 97.6°F | Resp 18 | Ht 68.0 in | Wt 325.2 lb

## 2018-01-10 DIAGNOSIS — M109 Gout, unspecified: Secondary | ICD-10-CM

## 2018-01-10 DIAGNOSIS — E042 Nontoxic multinodular goiter: Secondary | ICD-10-CM

## 2018-01-10 DIAGNOSIS — L819 Disorder of pigmentation, unspecified: Secondary | ICD-10-CM

## 2018-01-10 DIAGNOSIS — Z0000 Encounter for general adult medical examination without abnormal findings: Secondary | ICD-10-CM

## 2018-01-10 DIAGNOSIS — R7303 Prediabetes: Secondary | ICD-10-CM

## 2018-01-10 DIAGNOSIS — L989 Disorder of the skin and subcutaneous tissue, unspecified: Secondary | ICD-10-CM

## 2018-01-10 DIAGNOSIS — R030 Elevated blood-pressure reading, without diagnosis of hypertension: Secondary | ICD-10-CM

## 2018-01-10 DIAGNOSIS — I10 Essential (primary) hypertension: Secondary | ICD-10-CM

## 2018-01-10 MED ORDER — ATORVASTATIN CALCIUM 40 MG PO TABS: 40.0000 mg | ORAL_TABLET | Freq: Every day | ORAL | 3 refills | Status: AC

## 2018-01-10 MED ORDER — COLCHICINE 0.6 MG PO CAPS: ORAL_CAPSULE | ORAL | 1 refills | Status: DC

## 2018-01-10 MED ORDER — AMLODIPINE BESYLATE 10 MG PO TABS: 10.0000 mg | ORAL_TABLET | Freq: Every day | ORAL | 1 refills | Status: AC

## 2018-01-10 MED ORDER — LISINOPRIL 20 MG PO TABS: 20.0000 mg | ORAL_TABLET | Freq: Every day | ORAL | 3 refills | Status: AC

## 2018-01-10 NOTE — Progress Notes (Signed)
MRN: [id number]  Subjective:   [name] is a 47 y.o. male presenting for annual physical exam.  Patient is married, is trying to eat healthier and be more active.  Has good relationships at home, has a good support network. Reports that he is still drinking regularly, not every day but when he does drink he has about 5+ drinks including liquor and beers.  Medical care team includes: PCP: Jaynee Eagles, PA-C Vision: No visual deficits. Dental: Infrequent dental care.  Periodontist has removed teeth in the past. Specialists: None. Health Maintenance: Immunizations are up-to-date.  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  He is not yet a candidate for colonoscopy.  Patrick Russell has a current medication list which includes the following prescription(s): amlodipine, atorvastatin, colchicine, lisinopril, magnesium, meloxicam, fish oil, and vitamin b-12. He has No Known Allergies. Patrick Russell  has a past medical history of Gout, Hypertension, Obesity, Subclinical hyperthyroidism, Tinea pedis, and Tobacco use. Also  has no past surgical history on file. His family history includes Cancer in his father; Diabetes in his brother, father, and mother; Hypertension in his brother, father, and mother.  Review of Systems  Constitutional: Negative for chills, diaphoresis, fever, malaise/fatigue and weight loss.  HENT: Negative for congestion, ear discharge, ear pain, hearing loss, nosebleeds, sore throat and tinnitus.   Eyes: Negative for blurred vision, double vision, photophobia, pain, discharge and redness.  Respiratory: Negative for cough, shortness of breath and wheezing.   Cardiovascular: Negative for chest pain, palpitations and leg swelling.  Gastrointestinal: Negative for abdominal pain, blood in stool, constipation, diarrhea, nausea and vomiting.  Genitourinary: Negative for dysuria, flank pain, frequency, hematuria and urgency.    Musculoskeletal: Negative for back pain, joint pain and myalgias.  Skin: Negative for itching and rash.  Neurological: Negative for dizziness, tingling, seizures, loss of consciousness, weakness and headaches.  Endo/Heme/Allergies: Negative for polydipsia.  Psychiatric/Behavioral: Negative for depression, hallucinations, memory loss, substance abuse and suicidal ideas. The patient is not nervous/anxious and does not have insomnia.     Objective:   Vitals: BP (!) 150/99   Pulse 65   Temp 97.6 F (36.4 C) (Oral)   Resp 18   Ht 5\' 8"  (1.727 m)   Wt (!) 325 lb 3.2 oz (147.5 kg)   SpO2 97%   BMI 49.45 kg/m   BP Readings from Last 3 Encounters:  01/10/18 (!) 150/99  12/05/17 (!) 159/99  11/17/17 (!) 152/95    Wt Readings from Last 3 Encounters:  01/10/18 (!) 325 lb 3.2 oz (147.5 kg)  11/17/17 (!) 327 lb (148.3 kg)  09/07/17 (!) 325 lb (147.4 kg)     Visual Acuity Screening   Right eye Left eye Both eyes  Without correction: 20/30-1 20/30 20/25  With correction:       The 10-year ASCVD risk score Mikey Bussing DC Jr., et al., 2013) is: 18.6%   Values used to calculate the score:     Age: 17 years     Sex: Male     Is Non-Hispanic African American: Yes     Diabetic: Yes     Tobacco smoker: No     Systolic Blood Pressure: 308 mmHg     Is BP treated: Yes     HDL Cholesterol: 52 mg/dL     Total Cholesterol: 216 mg/dL   Physical Exam  Constitutional: He is oriented to person, place, and time. He appears well-developed and well-nourished.  Body habitus is morbidly obese.  HENT:  TM's intact bilaterally, no effusions or erythema. Nasal turbinates pink and moist, nasal passages patent. No sinus tenderness. Oropharynx clear, mucous membranes moist, dentition in good repair.  Eyes: Pupils are equal, round, and reactive to light. Conjunctivae and EOM are normal. Right eye exhibits no discharge. Left eye exhibits no discharge. No scleral icterus.  Neck: Normal range of motion. Neck  supple. No thyromegaly present.  Cardiovascular: Normal rate, regular rhythm, normal heart sounds and intact distal pulses. Exam reveals no gallop and no friction rub.  No murmur heard. Pulmonary/Chest: Effort normal and breath sounds normal. No stridor. No respiratory distress. He has no wheezes. He has no rales.  Abdominal: Soft. Bowel sounds are normal. He exhibits no distension and no mass. There is no tenderness. There is no rebound and no guarding.  Musculoskeletal: Normal range of motion. He exhibits no edema or tenderness.  Lymphadenopathy:    He has no cervical adenopathy.  Neurological: He is alert and oriented to person, place, and time. He has normal reflexes. He displays normal reflexes. Coordination normal.  Skin: Skin is warm and dry. No rash noted. No erythema. No pallor.     Psychiatric: He has a normal mood and affect.   Assessment and Plan :   Annual physical exam  Pre-diabetes - Plan: Hemoglobin A1c  Gout without tophus - Plan: Uric acid, Colchicine (MITIGARE) 0.6 MG CAPS  Essential hypertension - Plan: Comprehensive metabolic panel, Lipid panel, TSH, amLODipine (NORVASC) 10 MG tablet  Elevated blood pressure reading - Plan: CBC  Skin lesion of face - Plan: Ambulatory referral to Dermatology  Hyperpigmentation of skin - Plan: Ambulatory referral to Dermatology  Labs pending, patient is medically stable.  He requested a weight loss medication today.  I counseled against this given his blood pressure.  Also counseled that weight loss and giving up alcohol may help him tremendously.  After long discussion, patient was agreeable to this.  He is not agreeable to going back to have a CPAP titration.  He has OSA but is not willing to use the machine. Discussed healthy lifestyle, diet, exercise, preventative care, vaccinations, and addressed patient's concerns.  I refilled his medications today. Counseled patient on potential for adverse effects with medications prescribed  today, patient verbalized understanding.  She will continue to monitor his blood pressure at home.  Recheck in 3 months.  Jaynee Eagles, PA-C [facility] [phone] 01/10/2018  9:36 AM

## 2018-01-10 NOTE — Patient Instructions (Addendum)
Cut your alcohol use by half every week until you are able to stop. Then hold off on drinking to help with your gout and weight loss.    Salads - kale, spinach, cabbage, spring mix; use seeds like pumpkin seeds or sunflower seeds; you can also use 1-2 hard boiled eggs. Fruits - avocadoes, berries (blueberries, raspberries, blackberries), apples, oranges, pomegranate, grapefruit Seeds - quinoa, chia seeds; you can also incorporate oatmeal Vegetables - aspargus, cauliflower, broccoli, green beans, brussel spouts, bell peppers; stay away from starchy vegetables like potatoes, carrots, peas  Brussel sprouts - Cut off stems. Place in a mixing bowl that has a lid. Pour in a 1/4-1/2 cup olive oil, spices, use a light amount of parmesan. Place on a baking sheet. Bake for 10 minutes at 400F. Take it out, eat the brussel chips. Place for another 5-10 minutes.   Mashed cauliflower - Boil a bunch of cauliflower in a pot of water. Blend in a food processor with 1-2 tablespoons of butter.  Spaghetti squash -  Cut the squash in half very carefully, clean out seeds from the middle. Place 1/2 face down in a microwave safe dish with at least 2 inches of water. Make 4-6 slits on outside of spaghetti squash and microwave for 10-12 minutes. Take out the spaghetti using a metal spoon. Repeat for the other half.   Vega protein is good protein powder, make sure you use ~6 ice cubes to give it smoothie consistency together with ~4-6 ounces of vanilla soy milk. Throw cinnamon into your shake, use peanut butter. You can also use the fruits that I listed above. Throw spinach or kale into the shake.      Gout Gout is painful swelling that can happen in some of your joints. Gout is a type of arthritis. This condition is caused by having too much uric acid in your body. Uric acid is a chemical that is made when your body breaks down substances called purines. If your body has too much uric acid, sharp crystals can form and  build up in your joints. This causes pain and swelling. Gout attacks can happen quickly and be very painful (acute gout). Over time, the attacks can affect more joints and happen more often (chronic gout). Follow these instructions at home: During a Gout Attack  If directed, put ice on the painful area: ? Put ice in a plastic bag. ? Place a towel between your skin and the bag. ? Leave the ice on for 20 minutes, 2-3 times a day.  Rest the joint as much as possible. If the joint is in your leg, you may be given crutches to use.  Raise (elevate) the painful joint above the level of your heart as often as you can.  Drink enough fluids to keep your pee (urine) clear or pale yellow.  Take over-the-counter and prescription medicines only as told by your doctor.  Do not drive or use heavy machinery while taking prescription pain medicine.  Follow instructions from your doctor about what you can or cannot eat and drink.  Return to your normal activities as told by your doctor. Ask your doctor what activities are safe for you. Avoiding Future Gout Attacks  Follow a low-purine diet as told by a specialist (dietitian) or your doctor. Avoid foods and drinks that have a lot of purines, such as: ? Liver. ? Kidney. ? Anchovies. ? Asparagus. ? Herring. ? Mushrooms ? Mussels. ? Beer.  Limit alcohol intake to no more than  1 drink a day for nonpregnant women and 2 drinks a day for men. One drink equals 12 oz of beer, 5 oz of wine, or 1 oz of hard liquor.  Stay at a healthy weight or lose weight if you are overweight. If you want to lose weight, talk with your doctor. It is important that you do not lose weight too fast.  Start or continue an exercise plan as told by your doctor.  Drink enough fluids to keep your pee clear or pale yellow.  Take over-the-counter and prescription medicines only as told by your doctor.  Keep all follow-up visits as told by your doctor. This is  important. Contact a doctor if:  You have another gout attack.  You still have symptoms of a gout attack after10 days of treatment.  You have problems (side effects) because of your medicines.  You have chills or a fever.  You have burning pain when you pee (urinate).  You have pain in your lower back or belly. Get help right away if:  You have very bad pain.  Your pain cannot be controlled.  You cannot pee. This information is not intended to replace advice given to you by your health care provider. Make sure you discuss any questions you have with your health care provider. Document Released: 03/01/2008 Document Revised: 10/29/2015 Document Reviewed: 03/05/2015 Elsevier Interactive Patient Education  2018 [place] Maintenance, Male A healthy lifestyle and preventive care is important for your health and wellness. Ask your health care provider about what schedule of regular examinations is right for you. What should I know about weight and diet? Eat a Healthy Diet  Eat plenty of vegetables, fruits, whole grains, low-fat dairy products, and lean protein.  Do not eat a lot of foods high in solid fats, added sugars, or salt.  Maintain a Healthy Weight Regular exercise can help you achieve or maintain a healthy weight. You should:  Do at least 150 minutes of exercise each week. The exercise should increase your heart rate and make you sweat (moderate-intensity exercise).  Do strength-training exercises at least twice a week.  Watch Your Levels of Cholesterol and Blood Lipids  Have your blood tested for lipids and cholesterol every 5 years starting at 48 years of age. If you are at high risk for heart disease, you should start having your blood tested when you are 48 years old. You may need to have your cholesterol levels checked more often if: ? Your lipid or cholesterol levels are high. ? You are older than 48 years of age. ? You are at high risk for heart  disease.  What should I know about cancer screening? Many types of cancers can be detected early and may often be prevented. Lung Cancer  You should be screened every year for lung cancer if: ? You are a current smoker who has smoked for at least 30 years. ? You are a former smoker who has quit within the past 15 years.  Talk to your health care provider about your screening options, when you should start screening, and how often you should be screened.  Colorectal Cancer  Routine colorectal cancer screening usually begins at 48 years of age and should be repeated every 5-10 years until you are 48 years old. You may need to be screened more often if early forms of precancerous polyps or small growths are found. Your health care provider may recommend screening at an earlier age if you have risk factors  for colon cancer.  Your health care provider may recommend using home test kits to check for hidden blood in the stool.  A small camera at the end of a tube can be used to examine your colon (sigmoidoscopy or colonoscopy). This checks for the earliest forms of colorectal cancer.  Prostate and Testicular Cancer  Depending on your age and overall health, your health care provider may do certain tests to screen for prostate and testicular cancer.  Talk to your health care provider about any symptoms or concerns you have about testicular or prostate cancer.  Skin Cancer  Check your skin from head to toe regularly.  Tell your health care provider about any new moles or changes in moles, especially if: ? There is a change in a mole's size, shape, or color. ? You have a mole that is larger than a pencil eraser.  Always use sunscreen. Apply sunscreen liberally and repeat throughout the day.  Protect yourself by wearing long sleeves, pants, a wide-brimmed hat, and sunglasses when outside.  What should I know about heart disease, diabetes, and high blood pressure?  If you are 18-39 years  of age, have your blood pressure checked every 3-5 years. If you are 39 years of age or older, have your blood pressure checked every year. You should have your blood pressure measured twice-once when you are at a hospital or clinic, and once when you are not at a hospital or clinic. Record the average of the two measurements. To check your blood pressure when you are not at a hospital or clinic, you can use: ? An automated blood pressure machine at a pharmacy. ? A home blood pressure monitor.  Talk to your health care provider about your target blood pressure.  If you are between 51-1 years old, ask your health care provider if you should take aspirin to prevent heart disease.  Have regular diabetes screenings by checking your fasting blood sugar level. ? If you are at a normal weight and have a low risk for diabetes, have this test once every three years after the age of 52. ? If you are overweight and have a high risk for diabetes, consider being tested at a younger age or more often.  A one-time screening for abdominal aortic aneurysm (AAA) by ultrasound is recommended for men aged 6-75 years who are current or former smokers. What should I know about preventing infection? Hepatitis B If you have a higher risk for hepatitis B, you should be screened for this virus. Talk with your health care provider to find out if you are at risk for hepatitis B infection. Hepatitis C Blood testing is recommended for:  Everyone born from [birth year] through 1966.  Anyone with known risk factors for hepatitis C.  Sexually Transmitted Diseases (STDs)  You should be screened each year for STDs including gonorrhea and chlamydia if: ? You are sexually active and are younger than 48 years of age. ? You are older than 48 years of age and your health care provider tells you that you are at risk for this type of infection. ? Your sexual activity has changed since you were last screened and you are at an increased  risk for chlamydia or gonorrhea. Ask your health care provider if you are at risk.  Talk with your health care provider about whether you are at high risk of being infected with HIV. Your health care provider may recommend a prescription medicine to help prevent HIV infection.  What  else can I do?  Schedule regular health, dental, and eye exams.  Stay current with your vaccines (immunizations).  Do not use any tobacco products, such as cigarettes, chewing tobacco, and e-cigarettes. If you need help quitting, ask your health care provider.  Limit alcohol intake to no more than 2 drinks per day. One drink equals 12 ounces of beer, 5 ounces of wine, or 1 ounces of hard liquor.  Do not use street drugs.  Do not share needles.  Ask your health care provider for help if you need support or information about quitting drugs.  Tell your health care provider if you often feel depressed.  Tell your health care provider if you have ever been abused or do not feel safe at home. This information is not intended to replace advice given to you by your health care provider. Make sure you discuss any questions you have with your health care provider. Document Released: 11/19/2007 Document Revised: 01/20/2016 Document Reviewed: 02/24/2015 Elsevier Interactive Patient Education  2018 [employer].     IF you received an x-ray today, you will receive an invoice from [facility]. Please contact [facility] at [phone] with questions or concerns regarding your invoice.   IF you received labwork today, you will receive an invoice from [facility]. Please contact [facility] at [phone] with questions or concerns regarding your invoice.   Our billing staff will not be able to assist you with questions regarding bills from these companies.  You will be contacted with the lab results as soon as they are available. The fastest way to get your results is to activate your My Chart  account. Instructions are located on the last page of this paperwork. If you have not heard from [place] regarding the results in 2 weeks, please contact this office.

## 2018-01-11 LAB — HEMOGLOBIN A1C
Est. average glucose Bld gHb Est-mCnc: 137 mg/dL
HEMOGLOBIN A1C: 6.4 % — AB (ref 4.8–5.6)

## 2018-01-11 LAB — COMPREHENSIVE METABOLIC PANEL
ALBUMIN: 4.3 g/dL (ref 3.5–5.5)
ALK PHOS: 85 IU/L (ref 39–117)
ALT: 36 IU/L (ref 0–44)
AST: 35 IU/L (ref 0–40)
Albumin/Globulin Ratio: 1.8 (ref 1.2–2.2)
BILIRUBIN TOTAL: 0.4 mg/dL (ref 0.0–1.2)
BUN / CREAT RATIO: 15 (ref 9–20)
BUN: 11 mg/dL (ref 6–24)
CHLORIDE: 99 mmol/L (ref 96–106)
CO2: 22 mmol/L (ref 20–29)
Calcium: 9.1 mg/dL (ref 8.7–10.2)
Creatinine, Ser: 0.75 mg/dL — ABNORMAL LOW (ref 0.76–1.27)
GFR calc non Af Amer: 109 mL/min/{1.73_m2} (ref >59)
GFR, EST AFRICAN AMERICAN: 126 mL/min/{1.73_m2} (ref >59)
GLUCOSE: 111 mg/dL — AB (ref 65–99)
Globulin, Total: 2.4 g/dL (ref 1.5–4.5)
Potassium: 3.8 mmol/L (ref 3.5–5.2)
SODIUM: 141 mmol/L (ref 134–144)
TOTAL PROTEIN: 6.7 g/dL (ref 6.0–8.5)

## 2018-01-11 LAB — CBC
Hematocrit: 43.4 % (ref 37.5–51.0)
Hemoglobin: 14.4 g/dL (ref 13.0–17.7)
MCH: 27.4 pg (ref 26.6–33.0)
MCHC: 33.2 g/dL (ref 31.5–35.7)
MCV: 83 fL (ref 79–97)
PLATELETS: 312 10*3/uL (ref 150–450)
RBC: 5.25 x10E6/uL (ref 4.14–5.80)
RDW: 14.5 % (ref 12.3–15.4)
WBC: 4.7 10*3/uL (ref 3.4–10.8)

## 2018-01-11 LAB — LIPID PANEL
CHOLESTEROL TOTAL: 92 mg/dL — AB (ref 100–199)
Chol/HDL Ratio: 2.3 ratio (ref 0.0–5.0)
HDL: 40 mg/dL (ref >39)
LDL Calculated: 33 mg/dL (ref 0–99)
Triglycerides: 95 mg/dL (ref 0–149)
VLDL CHOLESTEROL CAL: 19 mg/dL (ref 5–40)

## 2018-01-11 LAB — TSH: TSH: 0.243 u[IU]/mL — AB (ref 0.450–4.500)

## 2018-01-11 LAB — URIC ACID: Uric Acid: 6.7 mg/dL (ref 3.7–8.6)

## 2018-01-13 DIAGNOSIS — E042 Nontoxic multinodular goiter: Secondary | ICD-10-CM | POA: Diagnosis not present

## 2018-01-13 NOTE — Addendum Note (Signed)
Addended by: Gari Crown D on: 01/13/2018 03:11 PM   Modules accepted: Orders

## 2018-01-14 LAB — T3, FREE: T3 FREE: 3.9 pg/mL (ref 2.0–4.4)

## 2018-01-14 LAB — T4, FREE: FREE T4: 1.12 ng/dL (ref 0.82–1.77)

## 2018-01-29 ENCOUNTER — Other Ambulatory Visit: Payer: Self-pay | Admitting: Podiatry

## 2018-02-08 ENCOUNTER — Telehealth: Payer: Self-pay | Admitting: Urgent Care

## 2018-02-08 NOTE — Telephone Encounter (Signed)
Called pt and left VM for him to call office and reschedule his appt with Freida Busman that is scheduled 04/11/18. When pt calls back, please reschedule him for :  hypertension  Thank you!

## 2018-02-15 ENCOUNTER — Encounter: Payer: Self-pay | Admitting: Urgent Care

## 2018-02-15 ENCOUNTER — Ambulatory Visit: Payer: 59 | Admitting: Urgent Care

## 2018-02-15 VITALS — BP 159/98 | HR 75 | Temp 97.2°F | Resp 18 | Ht 68.0 in | Wt 321.4 lb

## 2018-02-15 DIAGNOSIS — R030 Elevated blood-pressure reading, without diagnosis of hypertension: Secondary | ICD-10-CM | POA: Diagnosis not present

## 2018-02-15 DIAGNOSIS — M109 Gout, unspecified: Secondary | ICD-10-CM

## 2018-02-15 DIAGNOSIS — I10 Essential (primary) hypertension: Secondary | ICD-10-CM | POA: Diagnosis not present

## 2018-02-15 DIAGNOSIS — M25572 Pain in left ankle and joints of left foot: Secondary | ICD-10-CM

## 2018-02-15 DIAGNOSIS — M79675 Pain in left toe(s): Secondary | ICD-10-CM | POA: Diagnosis not present

## 2018-02-15 MED ORDER — PREDNISONE 20 MG PO TABS: ORAL_TABLET | ORAL | 0 refills | Status: DC

## 2018-02-15 NOTE — Progress Notes (Signed)
MRN: [id number] DOB: 07-Feb-1971  Subjective:   Patrick Russell is a 47 y.o. male presenting for 4 day history of left ankle pain and swelling, left great toe pain.  Patient has tried to use his Mitigare with minimal relief.  Also used ibuprofen.  He does admit that he he ate some steak and shrimp this past week.  Has had previously high elevations of his uric acid level.  Has cut back on his alcohol use.  BP has been in 120s regularly except this week when he has had severe pain of his gout.  Denies trauma, falls, arthritis.  He missed work yesterday and today and was requesting work note.  Reports that he has been trying to do much better with his diet and has lost some weight.  Patrick Russell has a current medication list which includes the following prescription(s): amlodipine, atorvastatin, colchicine, ibuprofen, lisinopril, magnesium, meloxicam, fish oil, cyclobenzaprine, and hydrocodone-acetaminophen. Also has No Known Allergies.  Patrick Russell  has a past medical history of Gout, Hypertension, Obesity, Subclinical hyperthyroidism, Tinea pedis, and Tobacco use.   Objective:   Vitals: BP (!) 159/98   Pulse 75   Temp (!) 97.2 F (36.2 C) (Oral)   Resp 18   Ht 5\' 8"  (1.727 m)   Wt (!) 321 lb 6.4 oz (145.8 kg)   SpO2 96%   BMI 48.87 kg/m   BP Readings from Last 3 Encounters:  02/15/18 (!) 159/98  01/10/18 (!) 150/99  12/05/17 (!) 159/99    Wt Readings from Last 3 Encounters:  02/15/18 (!) 321 lb 6.4 oz (145.8 kg)  01/10/18 (!) 325 lb 3.2 oz (147.5 kg)  11/17/17 (!) 327 lb (148.3 kg)    Physical Exam  Constitutional: He is oriented to person, place, and time. He appears well-developed and well-nourished.  Cardiovascular: Normal rate.  Pulmonary/Chest: Effort normal.  Musculoskeletal:       Left ankle: He exhibits decreased range of motion and swelling. He exhibits no ecchymosis, no deformity, no laceration and normal pulse. Tenderness. Lateral malleolus and medial malleolus tenderness found.  No AITFL, no head of 5th metatarsal and no proximal fibula tenderness found. Achilles tendon exhibits no pain and no defect.       Left foot: There is tenderness (left 1st MTP) and swelling (left 1st MTP).  Neurological: He is alert and oriented to person, place, and time.    Assessment and Plan :   Gout without tophus - Plan: Uric Acid  Great toe pain, left  Essential hypertension  Elevated blood pressure reading  Acute left ankle pain  Will start short steroid course.  Hold off on any NSAIDs.  Uric counseled level pending.  Blood pressure elevated likely due to his pain.  Continue healthy lifestyle.  Follow-up if symptoms persist. Counseled patient on potential for adverse effects with medications prescribed today, patient verbalized understanding.   Wallis Bamberg, PA-C [facility] at [facility] [phone] 02/15/2018  12:24 PM

## 2018-02-15 NOTE — Patient Instructions (Addendum)
Do not take any other NSAID including diclofenac, ibuprofen, naproxen, Aleve, Motrin, colchicine (Mitigare) etc while taking prednisone.     [facility]:  [address] [place], [place] [address]  [phone] I'll be starting in October 2019.  [phone], [place], [place] [address]    Primary Care at [facility]  Dr. Mitchel Honour is a viable option for you as a PCP here.    Gout Gout is painful swelling that can occur in some of your joints. Gout is a type of arthritis. This condition is caused by having too much uric acid in your body. Uric acid is a chemical that forms when your body breaks down substances called purines. Purines are important for building body proteins. When your body has too much uric acid, sharp crystals can form and build up inside your joints. This causes pain and swelling. Gout attacks can happen quickly and be very painful (acute gout). Over time, the attacks can affect more joints and become more frequent (chronic gout). Gout can also cause uric acid to build up under your skin and inside your kidneys. What are the causes? This condition is caused by too much uric acid in your blood. This can occur because:  Your kidneys do not remove enough uric acid from your blood. This is the most common cause.  Your body makes too much uric acid. This can occur with some cancers and cancer treatments. It can also occur if your body is breaking down too many red blood cells (hemolytic anemia).  You eat too many foods that are high in purines. These foods include organ meats and some seafood. Alcohol, especially beer, is also high in purines.  A gout attack may be triggered by trauma or stress. What increases the risk? This condition is more likely to develop in people who:  Have a family history of gout.  Are male and middle-aged.  Are male and have gone through menopause.  Are obese.  Frequently drink alcohol,  especially beer.  Are dehydrated.  Lose weight too quickly.  Have an organ transplant.  Have lead poisoning.  Take certain medicines, including aspirin, cyclosporine, diuretics, levodopa, and niacin.  Have kidney disease or psoriasis.  What are the signs or symptoms? An attack of acute gout happens quickly. It usually occurs in just one joint. The most common place is the big toe. Attacks often start at night. Other joints that may be affected include joints of the feet, ankle, knee, fingers, wrist, or elbow. Symptoms may include:  Severe pain.  Warmth.  Swelling.  Stiffness.  Tenderness. The affected joint may be very painful to touch.  Shiny, red, or purple skin.  Chills and fever.  Chronic gout may cause symptoms more frequently. More joints may be involved. You may also have white or yellow lumps (tophi) on your hands or feet or in other areas near your joints. How is this diagnosed? This condition is diagnosed based on your symptoms, medical history, and physical exam. You may have tests, such as:  Blood tests to measure uric acid levels.  Removal of joint fluid with a needle (aspiration) to look for uric acid crystals.  X-rays to look for joint damage.  How is this treated? Treatment for this condition has two phases: treating an acute attack and preventing future attacks. Acute gout treatment may include medicines to reduce pain and swelling, including:  NSAIDs.  Steroids. These are strong anti-inflammatory medicines that can be taken  by mouth (orally) or injected into a joint.  Colchicine. This medicine relieves pain and swelling when it is taken soon after an attack. It can be given orally or through an IV tube.  Preventive treatment may include:  Daily use of smaller doses of NSAIDs or colchicine.  Use of a medicine that reduces uric acid levels in your blood.  Changes to your diet. You may need to see a specialist about healthy eating  (dietitian).  Follow these instructions at home: During a Gout Attack  If directed, apply ice to the affected area: ? Put ice in a plastic bag. ? Place a towel between your skin and the bag. ? Leave the ice on for 20 minutes, 2-3 times a day.  Rest the joint as much as possible. If the affected joint is in your leg, you may be given crutches to use.  Raise (elevate) the affected joint above the level of your heart as often as possible.  Drink enough fluids to keep your urine clear or pale yellow.  Take over-the-counter and prescription medicines only as told by your health care provider.  Do not drive or operate heavy machinery while taking prescription pain medicine.  Follow instructions from your health care provider about eating or drinking restrictions.  Return to your normal activities as told by your health care provider. Ask your health care provider what activities are safe for you. Avoiding Future Gout Attacks  Follow a low-purine diet as told by your dietitian or health care provider. Avoid foods and drinks that are high in purines, including liver, kidney, anchovies, asparagus, herring, mushrooms, mussels, and beer.  Limit alcohol intake to no more than 1 drink a day for nonpregnant women and 2 drinks a day for men. One drink equals 12 oz of beer, 5 oz of wine, or 1 oz of hard liquor.  Maintain a healthy weight or lose weight if you are overweight. If you want to lose weight, talk with your health care provider. It is important that you do not lose weight too quickly.  Start or maintain an exercise program as told by your health care provider.  Drink enough fluids to keep your urine clear or pale yellow.  Take over-the-counter and prescription medicines only as told by your health care provider.  Keep all follow-up visits as told by your health care provider. This is important. Contact a health care provider if:  You have another gout attack.  You continue to  have symptoms of a gout attack after10 days of treatment.  You have side effects from your medicines.  You have chills or a fever.  You have burning pain when you urinate.  You have pain in your lower back or belly. Get help right away if:  You have severe or uncontrolled pain.  You cannot urinate. This information is not intended to replace advice given to you by your health care provider. Make sure you discuss any questions you have with your health care provider. Document Released: 05/20/2000 Document Revised: 10/29/2015 Document Reviewed: 03/05/2015 Elsevier Interactive Patient Education  [employer].     If you have lab work done today you will be contacted with your lab results within the next 2 weeks.  If you have not heard from [place] then please contact us. The fastest way to get your results is to register for My Chart.   IF you received an x-ray today, you will receive an invoice from [facility]. Please contact [facility] at  [phone] with questions or concerns regarding your invoice.   IF you received labwork today, you will receive an invoice from [facility]. Please contact [facility] at [phone] with questions or concerns regarding your invoice.   Our billing staff will not be able to assist you with questions regarding bills from these companies.  You will be contacted with the lab results as soon as they are available. The fastest way to get your results is to activate your My Chart account. Instructions are located on the last page of this paperwork. If you have not heard from [place] regarding the results in 2 weeks, please contact this office.

## 2018-02-16 LAB — URIC ACID: Uric Acid: 7.5 mg/dL (ref 3.7–8.6)

## 2018-02-27 ENCOUNTER — Other Ambulatory Visit: Payer: Self-pay | Admitting: Podiatry

## 2018-02-28 ENCOUNTER — Institutional Professional Consult (permissible substitution): Payer: 59 | Admitting: Neurology

## 2018-04-11 ENCOUNTER — Encounter (HOSPITAL_COMMUNITY): Payer: Self-pay | Admitting: Emergency Medicine

## 2018-04-11 ENCOUNTER — Ambulatory Visit (HOSPITAL_COMMUNITY)
Admission: EM | Admit: 2018-04-11 | Discharge: 2018-04-11 | Disposition: A | Discharge status: Home / Self Care | Payer: 59 | Attending: Family Medicine | Admitting: Family Medicine

## 2018-04-11 ENCOUNTER — Ambulatory Visit: Payer: 59 | Admitting: Urgent Care

## 2018-04-11 DIAGNOSIS — L0291 Cutaneous abscess, unspecified: Secondary | ICD-10-CM | POA: Diagnosis not present

## 2018-04-11 MED ORDER — HYDROCODONE-ACETAMINOPHEN 5-325 MG PO TABS: 1.0000 | ORAL_TABLET | Freq: Four times a day (QID) | ORAL | 0 refills | Status: DC | PRN

## 2018-04-11 MED ORDER — LIDOCAINE HCL 2 % IJ SOLN
INTRAMUSCULAR | Status: AC
  Filled 2018-04-11: qty 20

## 2018-04-11 MED ORDER — SULFAMETHOXAZOLE-TRIMETHOPRIM 800-160 MG PO TABS: 1.0000 | ORAL_TABLET | Freq: Two times a day (BID) | ORAL | 0 refills | Status: AC

## 2018-04-11 NOTE — Discharge Instructions (Signed)
You have had an abscess drained today and you may have had packing placed in the wound to help the abscess continue to drain at home. If packing was placed, please do not remove it. You may shower with the packing in place. Let the soapy water clean your wound. Do not scrub it. Keep your wound covered. Follow up at the Urgent Care in 2 days for a wound check and/or packing removal. Return to the Urgent Care immediately if you develop any of the following symptoms: fever, Increased redness or swelling around where your abscess was, increased pain, or generalized weakness or vomiting. ° °Be aware, pain medications may cause drowsiness. Please do not drive, operate heavy machinery or make important decisions while on this medication, it can cloud your judgement. °

## 2018-04-11 NOTE — ED Triage Notes (Signed)
Pt here for abscess to right chest area with some drainage per pt

## 2018-04-11 NOTE — ED Provider Notes (Signed)
Patrick Russell   [id number] 04/11/18 Arrival Time: 4166  ASSESSMENT & PLAN:  1. Abscess    Chest wall.  Incision and Drainage Procedure Note  Anesthesia: 2% plain lidocaine  Procedure Details  The procedure, risks and complications have been discussed in detail (including, but not limited to pain and bleeding) with the patient.  The skin induration was prepped and draped in the usual fashion. After adequate local anesthesia, I&D with a #11 blade was performed on the R anterior chest wall under skin fold. Purulent drainage: present; copious.  EBL: minimal  Drains: packing placed  Condition: Tolerated procedure well  Complications: none.  Meds ordered this encounter  Medications  . sulfamethoxazole-trimethoprim (BACTRIM DS,SEPTRA DS) 800-160 MG tablet    Sig: Take 1 tablet by mouth 2 (two) times daily for 10 days.    Dispense:  20 tablet    Refill:  0  . HYDROcodone-acetaminophen (NORCO/VICODIN) 5-325 MG tablet    Sig: Take 1 tablet by mouth every 6 (six) hours as needed for moderate pain or severe pain.    Dispense:  8 tablet    Refill:  0   Wound care instructions discussed and given in written format. To return in 48 hours for wound check.  Finish all antibiotics. OTC analgesics as needed.  Reviewed expectations re: course of current medical issues. Questions answered. Outlined signs and symptoms indicating need for more acute intervention. Patient verbalized understanding. After Visit Summary given.   SUBJECTIVE:  Patrick Russell is a 47 y.o. male who presents with a possible abscess of his R anterior chest wall. H/O similar in the past requiring I&D. Reports gradual swelling over the past week. Increased tenderness. Noticed "smelly drainage" over the past 24 hours. Afebrile.  ROS: As per HPI.  OBJECTIVE:  Vitals:   04/11/18 1238  BP: (!) 164/98  Pulse: 80  Resp: 18  Temp: 98 F (36.7 C)  TempSrc: Oral  SpO2: 98%    General appearance: alert;  no distress Skin: approx 1.5 cm induration of his R anterior chest wall under R breast; tender to touch; no active drainage or bleeding; no significant skin erythema Psychological: alert and cooperative; normal mood and affect  No Known Allergies  Past Medical History:  Diagnosis Date  . Gout    worse with Allopurinol  . Hypertension   . Obesity   . Subclinical hyperthyroidism   . Tinea pedis   . Tobacco use    Social History   Socioeconomic History  . Marital status: Married    Spouse name: [name]  . Number of children: 3  . Years of education: college  . Highest education level: Not on file  Occupational History  . Occupation: Probation officer    Comment: [employer]  . Financial resource strain: Not on file  . Food insecurity:    Worry: Not on file    Inability: Not on file  . Transportation needs:    Medical: Not on file    Non-medical: Not on file  Tobacco Use  . Smoking status: Former Smoker    Packs/day: 0.50    Types: Cigarettes    Last attempt to quit: 07/07/2013    Years since quitting: 4.7  . Smokeless tobacco: Never Used  . Tobacco comment: Quit 2014  Substance and Sexual Activity  . Alcohol use: Yes    Alcohol/week: 0.0 standard drinks    Comment: weekends only: some beer, a couple of shots  . Drug use: No  .  Sexual activity: Yes    Partners: Female  Lifestyle  . Physical activity:    Days per week: Not on file    Minutes per session: Not on file  . Stress: Not on file  Relationships  . Social connections:    Talks on phone: Not on file    Gets together: Not on file    Attends religious service: Not on file    Active member of club or organization: Not on file    Attends meetings of clubs or organizations: Not on file    Relationship status: Not on file  Other Topics Concern  . Not on file  Social History Narrative   Lives with his wife and their daughter, when she is not at school.     His younger son is in college  and lives independently in [place], [place]. When he comes "home" he stays at his mother's home.    His older son is married and lives independently here in [place].   Family History  Problem Relation Age of Onset  . Hypertension Mother   . Diabetes Mother        controlled with weight loss  . Cancer Father        prostate  . Diabetes Father        controlled with weight loss  . Hypertension Father   . Diabetes Brother   . Hypertension Brother    History reviewed. No pertinent surgical history.         Vanessa Kick, MD 04/11/18 1350

## 2018-04-12 ENCOUNTER — Telehealth (HOSPITAL_COMMUNITY): Payer: Self-pay | Admitting: Emergency Medicine

## 2018-04-12 NOTE — Telephone Encounter (Signed)
Pt reports bright red blood from his I&D, swelling, increased pain and fatigue.  He does not have gauze at home so he has only been able to use a band aid for dressing.  He states he is "bleeding profusely" from the site.  He also reports he is taking his antibiotic as prescribed.  I explained to the pt that if he is having all of these symptoms that it would be best for him to come back in and be reevaluated.  I offered to make him an appointment for today, but he refused.  He is supposed to return tomorrow for a follow up and he indicated he was going to wait until then.   I spoke with Dr. Joseph Art and made him aware.

## 2018-04-13 ENCOUNTER — Ambulatory Visit (HOSPITAL_COMMUNITY)
Admission: EM | Admit: 2018-04-13 | Discharge: 2018-04-13 | Disposition: A | Discharge status: Home / Self Care | Payer: 59 | Attending: Internal Medicine | Admitting: Internal Medicine

## 2018-04-13 ENCOUNTER — Encounter (HOSPITAL_COMMUNITY): Payer: Self-pay | Admitting: Emergency Medicine

## 2018-04-13 DIAGNOSIS — L0291 Cutaneous abscess, unspecified: Secondary | ICD-10-CM

## 2018-04-13 MED ORDER — OXYCODONE-ACETAMINOPHEN 5-325 MG PO TABS: 1.0000 | ORAL_TABLET | Freq: Four times a day (QID) | ORAL | 0 refills | Status: DC | PRN

## 2018-04-13 MED ORDER — LIDOCAINE HCL (PF) 1 % IJ SOLN
INTRAMUSCULAR | Status: AC
  Filled 2018-04-13: qty 4

## 2018-04-13 MED ORDER — CEFTRIAXONE SODIUM 1 G IJ SOLR
1.0000 g | Freq: Once | INTRAMUSCULAR | Status: AC
  Administered 2018-04-13: 1 g via INTRAMUSCULAR

## 2018-04-13 MED ORDER — CEFTRIAXONE SODIUM 1 G IJ SOLR
INTRAMUSCULAR | Status: AC
  Filled 2018-04-13: qty 10

## 2018-04-13 MED ORDER — CEPHALEXIN 500 MG PO CAPS: 500.0000 mg | ORAL_CAPSULE | Freq: Four times a day (QID) | ORAL | 0 refills | Status: DC

## 2018-04-13 NOTE — Discharge Instructions (Addendum)
I believe that the abscess has gotten worse and is spreading There is not an open area for any more drainage to come out I believe he may need some IV antibiotics and ultrasound to make sure there is not a deeper abscess Since you do not want to go to the ER then we will try a Rocephin injection here We will add another antibiotic for you to take at home.  If you start developing worsening pain, more spreading of the infection, fevers, chills please go to the ER

## 2018-04-13 NOTE — ED Provider Notes (Signed)
[place]    CSN: [id number] Arrival date & time: 04/13/18  0272     History   Chief Complaint Chief Complaint  Patient presents with  . Abscess  . Follow-up    HPI Patrick Russell is a 47 y.o. male.   Pt is a 47 year old male that presents for abscess re check. Reports that the abscess had gotten worse and mildly draining. The area is more painful, increased redness with streaking and increased warmth. He has been taking the Bactrim and hydrocodone as prescribed. The pain medication is not helping. He denies any fever, chill, body aches.   ROS per HPI      Past Medical History:  Diagnosis Date  . Gout    worse with Allopurinol  . Hypertension   . Obesity   . Subclinical hyperthyroidism   . Tinea pedis   . Tobacco use     Patient Active Problem List   Diagnosis Date Noted  . Low TSH level 02/13/2017  . Osteoarthritis of left knee 02/11/2017  . Back pain 02/11/2017  . Primary osteoarthritis of knees, bilateral 06/24/2016  . Prediabetes 11/22/2015  . Gout 11/01/2015  . OSA (obstructive sleep apnea) 10/13/2015  . Low testosterone 05/02/2015  . Hypertension 07/16/2013  . History of tobacco use 07/16/2013  . Insomnia 07/16/2013  . Obesity     History reviewed. No pertinent surgical history.     Home Medications    Prior to Admission medications   Medication Sig Start Date End Date Taking? Authorizing Provider  amLODipine (NORVASC) 10 MG tablet Take 1 tablet (10 mg total) by mouth daily. 01/10/18   Jaynee Eagles, PA-C  atorvastatin (LIPITOR) 40 MG tablet Take 1 tablet (40 mg total) by mouth daily. 01/10/18   Jaynee Eagles, PA-C  cephALEXin (KEFLEX) 500 MG capsule Take 1 capsule (500 mg total) by mouth 4 (four) times daily. 04/13/18   Loura Halt A, NP  Colchicine (MITIGARE) 0.6 MG CAPS Take 2 tablets a the start of a gout attack. Take 1 more tablet an hour later. Do not repeat dosing for 2 days. 01/10/18   Jaynee Eagles, PA-C  cyclobenzaprine (FLEXERIL) 10  MG tablet TAKE 1 TABLET BY MOUTH AT BEDTIME FOR BACK SPASMS 01/26/18   [provider]  HYDROcodone-acetaminophen (NORCO/VICODIN) 5-325 MG tablet Take 1 tablet by mouth every 6 (six) hours as needed for moderate pain or severe pain. 04/11/18   Vanessa Kick, MD  lisinopril (PRINIVIL,ZESTRIL) 20 MG tablet Take 1 tablet (20 mg total) by mouth daily. 01/10/18   Jaynee Eagles, PA-C  Magnesium 250 MG TABS Take 500 mg by mouth daily.    [provider]  meloxicam (MOBIC) 15 MG tablet TAKE 1 TABLET BY MOUTH EVERY DAY 01/29/18   Gardiner Barefoot, DPM  Omega-3 Fatty Acids (FISH OIL) 1200 MG CAPS Take 1,200 mg by mouth 3 (three) times daily.    [provider]  oxyCODONE-acetaminophen (PERCOCET/ROXICET) 5-325 MG tablet Take 1-2 tablets by mouth every 6 (six) hours as needed for severe pain. 04/13/18   Duane Trias, Tressia Miners A, NP  predniSONE (DELTASONE) 20 MG tablet Day 1-5: Take 2 tablets daily with breakfast. Day 6-10: Take 1 tablet with breakfast. Patient not taking: Reported on 04/11/2018 02/15/18   Jaynee Eagles, PA-C  sulfamethoxazole-trimethoprim (BACTRIM DS,SEPTRA DS) 800-160 MG tablet Take 1 tablet by mouth 2 (two) times daily for 10 days. 04/11/18 04/21/18  Vanessa Kick, MD    Family History Family History  Problem Relation Age of Onset  .  Hypertension Mother   . Diabetes Mother        controlled with weight loss  . Cancer Father        prostate  . Diabetes Father        controlled with weight loss  . Hypertension Father   . Diabetes Brother   . Hypertension Brother     Social History Social History   Tobacco Use  . Smoking status: Former Smoker    Packs/day: 0.50    Types: Cigarettes    Last attempt to quit: 07/07/2013    Years since quitting: 4.7  . Smokeless tobacco: Never Used  . Tobacco comment: Quit 2014  Substance Use Topics  . Alcohol use: Yes    Alcohol/week: 0.0 standard drinks    Comment: weekends only: some beer, a couple of shots  . Drug use: No      Allergies   Patient has no known allergies.   Review of Systems Review of Systems   Physical Exam Triage Vital Signs ED Triage Vitals [04/13/18 1022]  Enc Vitals Group     BP (!) 142/88     Pulse Rate 88     Resp 16     Temp 98.2 F (36.8 C)     Temp src      SpO2 98 %     Weight      Height      Head Circumference      Peak Flow      Pain Score 3     Pain Loc      Pain Edu?      Excl. in [place]?    No data found.  Updated Vital Signs BP (!) 142/88   Pulse 88   Temp 98.2 F (36.8 C)   Resp 16   SpO2 98%   Visual Acuity Right Eye Distance:   Left Eye Distance:   Bilateral Distance:    Right Eye Near:   Left Eye Near:    Bilateral Near:     Physical Exam  Constitutional: He is oriented to person, place, and time. He appears well-developed and well-nourished.  Very pleasant. Non toxic or ill appearing.   HENT:  Head: Normocephalic and atraumatic.  Eyes: Conjunctivae are normal.  Neck: Normal range of motion.  Pulmonary/Chest: Effort normal.  Musculoskeletal: Normal range of motion.  Neurological: He is alert and oriented to person, place, and time.  Skin: Skin is warm and dry.  See picture Area where incision was has closed up. Very small whole for drainage from the area. Some serosanguinous fluid expressed. Right breast warm with redness and streaking extending into the right upper chest area. Area indurated and very tender to touch.   Psychiatric: He has a normal mood and affect.  Nursing note and vitals reviewed.      UC Treatments / Results  Labs (all labs ordered are listed, but only abnormal results are displayed) Labs Reviewed - No data to display  EKG None  Radiology No results found.  Procedures Procedures (including critical care time)  Medications Ordered in UC Medications  cefTRIAXone (ROCEPHIN) injection 1 g (1 g Intramuscular Given 04/13/18 1151)    Initial Impression / Assessment and Plan / UC Course  I have  reviewed the triage vital signs and the nursing notes.  Pertinent labs & imaging results that were available during my care of the patient were reviewed by me and considered in my medical decision making (see chart for details).  Pt is a 46 year old male that presents with worsening and extending abscess. He has been taking the medication as prescribed.  Based on the worsening abscess despite outpatient treatment I recommended patient go to the ER for IV antibiotics and possible ultrasound of the area.  Patient adamant about not going to the ER based on financial reasons. Patient is nontoxic or ill-appearing with normal vital signs We will try a Rocephin injection here in clinic and add Keflex to the antibiotic regimen.  Very strict precautions that if he is worse in the next 24 hours and develops fever and worsening spread of the infection that he needs to go to the ER Patient understanding and agreeable to plan Final Clinical Impressions(s) / UC Diagnoses   Final diagnoses:  Abscess     Discharge Instructions     I believe that the abscess has gotten worse and is spreading There is not an open area for any more drainage to come out I believe he may need some IV antibiotics and ultrasound to make sure there is not a deeper abscess Since you do not want to go to the ER then we will try a Rocephin injection here We will add another antibiotic for you to take at home.  If you start developing worsening pain, more spreading of the infection, fevers, chills please go to the ER     ED Prescriptions    Medication Sig Dispense Auth. Provider   cephALEXin (KEFLEX) 500 MG capsule Take 1 capsule (500 mg total) by mouth 4 (four) times daily. 40 capsule Aliceson Dolbow A, NP   oxyCODONE-acetaminophen (PERCOCET/ROXICET) 5-325 MG tablet Take 1-2 tablets by mouth every 6 (six) hours as needed for severe pain. 10 tablet Loura Halt A, NP     Controlled Substance Prescriptions  Controlled  Substance Registry consulted? Not Applicable   Orvan July, NP 04/13/18 1233

## 2018-04-17 ENCOUNTER — Ambulatory Visit: Payer: 59 | Admitting: Urgent Care

## 2018-05-03 ENCOUNTER — Other Ambulatory Visit: Payer: Self-pay | Admitting: Urgent Care

## 2018-05-03 DIAGNOSIS — M109 Gout, unspecified: Secondary | ICD-10-CM

## 2018-05-04 NOTE — Telephone Encounter (Signed)
Requested medication (s) are due for refill today: Yes  Requested medication (s) are on the active medication list: Yes  Last refill:  09/07/17  Future visit scheduled: No  Notes to clinic:  See request    Requested Prescriptions  Pending Prescriptions Disp Refills   MITIGARE 0.6 MG CAPS [Pharmacy Med Name: MITIGARE 0.6 MG CAPSULE] 60 capsule 5    Sig: TAKE 2 CAPSULES BY MOUTH AT START OF GOUT FLARE, THEN 1 MORE 1 HOUR LATER, DO NOT REPEAT FOR 2 DAYS     Endocrinology:  Gout Agents Failed - 05/03/2018 11:07 AM      Failed - Cr in normal range and within 360 days    Creat  Date Value Ref Range Status  04/05/2016 0.77 0.60 - 1.35 mg/dL Final   Creatinine, Ser  Date Value Ref Range Status  01/10/2018 0.75 (L) 0.76 - 1.27 mg/dL Final         Passed - Uric Acid in normal range and within 360 days    Uric Acid  Date Value Ref Range Status  02/15/2018 7.5 3.7 - 8.6 mg/dL Final    Comment:               Therapeutic target for gout patients: <6.0         Passed - Valid encounter within last 12 months    Recent Outpatient Visits          2 months ago Gout without tophus   [facility] at [place], PA-C   3 months ago Annual physical exam   [facility] at [place], PA-C   5 months ago Essential hypertension   [facility] at [place], PA-C   7 months ago Morbid obesity [facility])   Primary Care at [place], [place]   1 year ago Annual physical exam   Primary Care at [facility] [place], [place]

## 2018-05-04 NOTE — Telephone Encounter (Signed)
MyChart message sent to pt about making an appt for medication refill

## 2018-07-11 ENCOUNTER — Ambulatory Visit: Payer: 59 | Admitting: Podiatry

## 2018-07-11 ENCOUNTER — Ambulatory Visit (INDEPENDENT_AMBULATORY_CARE_PROVIDER_SITE_OTHER): Payer: 59

## 2018-07-11 ENCOUNTER — Encounter: Payer: Self-pay | Admitting: Podiatry

## 2018-07-11 DIAGNOSIS — M722 Plantar fascial fibromatosis: Secondary | ICD-10-CM

## 2018-07-11 DIAGNOSIS — M7752 Other enthesopathy of left foot: Secondary | ICD-10-CM

## 2018-07-11 DIAGNOSIS — G5792 Unspecified mononeuropathy of left lower limb: Secondary | ICD-10-CM

## 2018-07-11 MED ORDER — MELOXICAM 15 MG PO TABS: 15.0000 mg | ORAL_TABLET | Freq: Every day | ORAL | 1 refills | Status: DC

## 2018-07-11 MED ORDER — METHYLPREDNISOLONE 4 MG PO TBPK: ORAL_TABLET | ORAL | 0 refills | Status: DC

## 2018-07-15 NOTE — Progress Notes (Signed)
   HPI: 48 year old male presenting today for follow up evaluation of left foot pain. He states the pain is worsening and is now throbbing at night time. He also reports pain to the dorsolateral left foot that has been ongoing for the past 1-2 months. Walking increases the pain. He has been taking Meloxicam for treatment. Patient is here for further evaluation and treatment.   Past Medical History:  Diagnosis Date  . Gout    worse with Allopurinol  . Hypertension   . Obesity   . Subclinical hyperthyroidism   . Tinea pedis   . Tobacco use      Physical Exam: General: The patient is alert and oriented x3 in no acute distress.  Dermatology: Skin is warm, dry and supple bilateral lower extremities. Negative for open lesions or macerations.  Vascular: Palpable pedal pulses bilaterally. No edema or erythema noted. Capillary refill within normal limits.  Neurological: Epicritic and protective threshold grossly intact bilaterally.   Musculoskeletal Exam: Pain with palpation noted to the lateral aspect of the extensor tendon sheath of the left foot. Range of motion within normal limits to all pedal and ankle joints bilateral. Muscle strength 5/5 in all groups bilateral.   Radiographic Exam:  Normal osseous mineralization. Joint spaces preserved. No fracture/dislocation/boney destruction.    Assessment: 1. Left lateral extensor tendinitis  2. Neuritis left foot    Plan of Care:  1. Patient evaluated. X-Rays reviewed.  2. Injection of 0.5 mLs Celestone Soluspan injected into the lateral left midfoot along the extensor tendons.  3. Prescription for Medrol Dose Pak provided to patient. 4. Prescription for Meloxicam provided to patient. 5. Compression anklet dispensed.  6. Return to clinic in 4 weeks.       Edrick Kins, DPM [facility]  Dr. Edrick Kins, DPM    [address], [place] [address]                Office  [phone]  Fax [phone]

## 2018-08-08 ENCOUNTER — Ambulatory Visit: Payer: 59 | Admitting: Podiatry

## 2018-08-08 ENCOUNTER — Encounter: Payer: Self-pay | Admitting: Podiatry

## 2018-08-08 ENCOUNTER — Ambulatory Visit (INDEPENDENT_AMBULATORY_CARE_PROVIDER_SITE_OTHER): Payer: 59

## 2018-08-08 DIAGNOSIS — M109 Gout, unspecified: Secondary | ICD-10-CM

## 2018-08-08 DIAGNOSIS — G5792 Unspecified mononeuropathy of left lower limb: Secondary | ICD-10-CM | POA: Diagnosis not present

## 2018-08-08 DIAGNOSIS — M7661 Achilles tendinitis, right leg: Secondary | ICD-10-CM

## 2018-08-08 DIAGNOSIS — M7752 Other enthesopathy of left foot: Secondary | ICD-10-CM

## 2018-08-08 DIAGNOSIS — M722 Plantar fascial fibromatosis: Secondary | ICD-10-CM

## 2018-08-08 MED ORDER — METHYLPREDNISOLONE 4 MG PO TBPK: ORAL_TABLET | ORAL | 0 refills | Status: DC

## 2018-08-13 NOTE — Progress Notes (Signed)
   HPI: 48 year old male presenting today for follow up evaluation of left foot pain. He reports continued pain and states it is not improving at all. He now reports pain and swelling of the posterior right ankle. Walking and bearing weight increases the pain. He has been taking Meloxicam for treatment. Patient is here for further evaluation and treatment.   Past Medical History:  Diagnosis Date  . Gout    worse with Allopurinol  . Hypertension   . Obesity   . Subclinical hyperthyroidism   . Tinea pedis   . Tobacco use      Physical Exam: General: The patient is alert and oriented x3 in no acute distress.  Dermatology: Skin is warm, dry and supple bilateral lower extremities. Negative for open lesions or macerations.  Vascular: Palpable pedal pulses bilaterally. No edema or erythema noted. Capillary refill within normal limits.  Neurological: Epicritic and protective threshold grossly intact bilaterally.   Musculoskeletal Exam: Pain with palpation noted to the lateral aspect of the extensor tendon sheath of the left foot. Pain on palpation noted to the posterior tubercle of the right calcaneus at the insertion of the Achilles tendon consistent with retrocalcaneal bursitis. Range of motion within normal limits to all pedal and ankle joints bilateral. Muscle strength 5/5 in all groups bilateral.   Radiographic Exam:  Normal osseous mineralization. Joint spaces preserved. No fracture/dislocation/boney destruction.    Assessment: 1. Left lateral extensor tendinitis  2. Neuritis left foot  3. Achilles tendinitis right    Plan of Care:  1. Patient evaluated. X-Rays reviewed.  2. Reorder prescription for Medrol Dose Pak provided for patient. Patient did not take it as directed.  3. Pre-authorization for custom orthotics requested from [facility].  4. Patient wants to go to urgent care to see if they will give him an injection in the buttocks for systemic relief.  5. Return to clinic as  needed.        Edrick Kins, DPM [facility]  Dr. Edrick Kins, DPM    [address], [place] [address]                Office [phone]  Fax [phone]

## 2018-08-14 ENCOUNTER — Telehealth: Payer: Self-pay | Admitting: Podiatry

## 2018-08-14 NOTE — Telephone Encounter (Signed)
Left message for pt to call to confirm he wants to proceed with ordering the orthotics.

## 2018-08-29 ENCOUNTER — Other Ambulatory Visit: Payer: Self-pay | Admitting: Urgent Care

## 2018-08-29 DIAGNOSIS — I10 Essential (primary) hypertension: Secondary | ICD-10-CM

## 2018-09-07 IMAGING — DX DG CHEST 1V PORT
1 series · 1 of 1 positions shown · non-contrast
Comparison: Chest radiograph 01/07/2009

CLINICAL DATA: Shortness of breath

EXAM:
PORTABLE CHEST 1 VIEW

[chest ap]
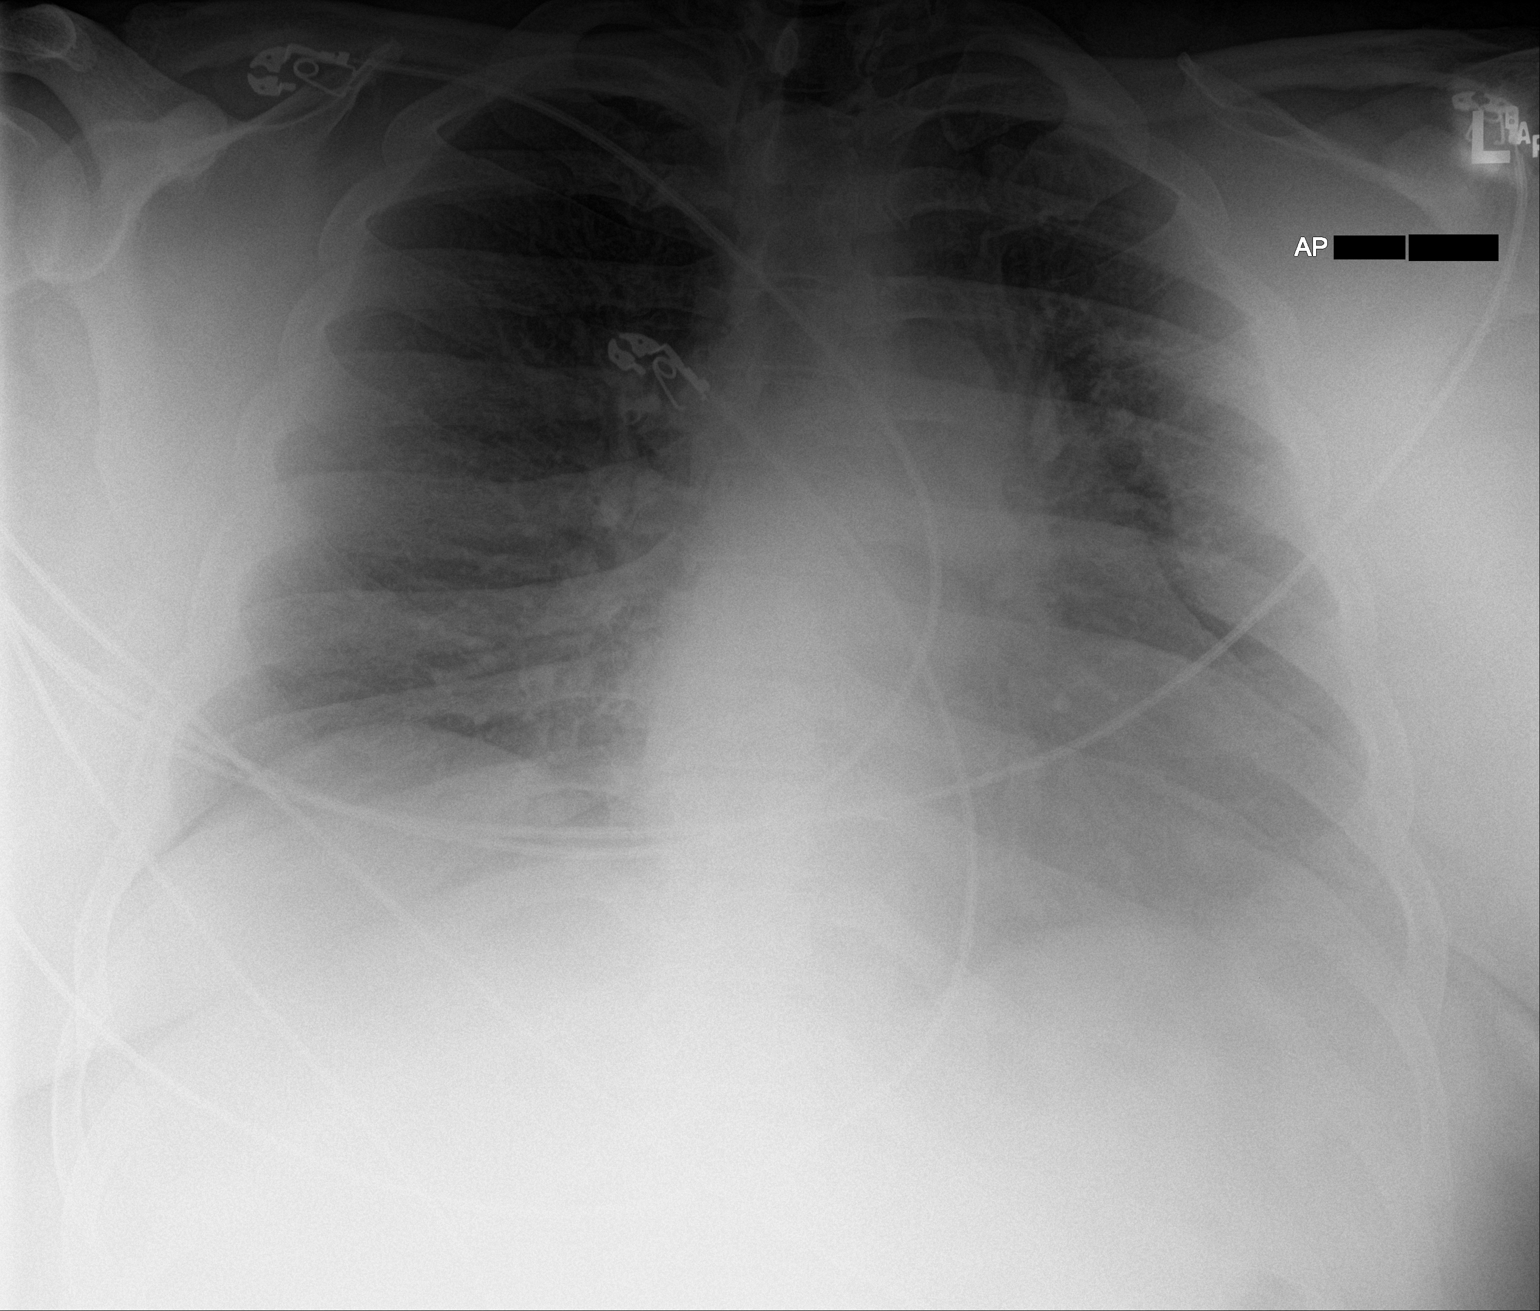

[1 of 1 positions shown; findings below may reference images not displayed]

FINDINGS: Unchanged cardiomegaly. No focal consolidation or pulmonary edema.
Visualization is poor due to poor penetration secondary to body
habitus.
IMPRESSION: Cardiomegaly without acute airspace disease.

## 2018-09-18 ENCOUNTER — Other Ambulatory Visit: Payer: Self-pay | Admitting: Urgent Care

## 2018-09-18 DIAGNOSIS — I10 Essential (primary) hypertension: Secondary | ICD-10-CM

## 2018-09-26 ENCOUNTER — Telehealth: Payer: Self-pay | Admitting: Podiatry

## 2018-09-26 NOTE — Telephone Encounter (Signed)
Called pt to confirm appt for 4.23.2020 and ask cdc prescreening questions and pt asked if the orthotics where covered.   I checked pts account and it looks like they did not get covered and he said he was told they were over 700 dollars and was told his benefits would be checked before ordering. I explained that the cost is 398.00 and they are custom made to him and cannot be returned. He did sign a financial agreement that if they were not covered he would be responsible. I also gave him the payment plan of 100.00 down and make payments. He canceled his appt and will call to reschedule when he can afford to pay for them.

## 2018-09-27 ENCOUNTER — Encounter: Payer: 59 | Admitting: Orthotics

## 2018-11-02 ENCOUNTER — Other Ambulatory Visit: Payer: Self-pay | Admitting: Podiatry

## 2018-11-13 NOTE — Telephone Encounter (Signed)
Pt is seeing Chelle Jeffrey's at [facility]

## 2019-02-27 ENCOUNTER — Ambulatory Visit: Payer: 59 | Admitting: Neurology

## 2019-03-27 ENCOUNTER — Other Ambulatory Visit: Payer: Self-pay

## 2019-03-27 ENCOUNTER — Ambulatory Visit (HOSPITAL_COMMUNITY)
Admission: EM | Admit: 2019-03-27 | Discharge: 2019-03-27 | Disposition: A | Discharge status: Home / Self Care | Payer: 59 | Attending: Urgent Care | Admitting: Urgent Care

## 2019-03-27 ENCOUNTER — Encounter (HOSPITAL_COMMUNITY): Payer: Self-pay

## 2019-03-27 DIAGNOSIS — M791 Myalgia, unspecified site: Secondary | ICD-10-CM

## 2019-03-27 DIAGNOSIS — M109 Gout, unspecified: Secondary | ICD-10-CM | POA: Diagnosis present

## 2019-03-27 DIAGNOSIS — E785 Hyperlipidemia, unspecified: Secondary | ICD-10-CM

## 2019-03-27 DIAGNOSIS — M79652 Pain in left thigh: Secondary | ICD-10-CM

## 2019-03-27 DIAGNOSIS — I10 Essential (primary) hypertension: Secondary | ICD-10-CM | POA: Diagnosis present

## 2019-03-27 HISTORY — DX: Type 2 diabetes mellitus without complications: E11.9

## 2019-03-27 LAB — BASIC METABOLIC PANEL
Anion gap: 12 (ref 5–15)
BUN: 8 mg/dL (ref 6–20)
CO2: 27 mmol/L (ref 22–32)
Calcium: 9.1 mg/dL (ref 8.9–10.3)
Chloride: 102 mmol/L (ref 98–111)
Creatinine, Ser: 0.77 mg/dL (ref 0.61–1.24)
GFR calc Af Amer: >60 mL/min (ref >60)
GFR calc non Af Amer: >60 mL/min (ref >60)
Glucose, Bld: 104 mg/dL — ABNORMAL HIGH (ref 70–99)
Potassium: 3.1 mmol/L — ABNORMAL LOW (ref 3.5–5.1)
Sodium: 141 mmol/L (ref 135–145)

## 2019-03-27 LAB — CK: Total CK: 376 U/L (ref 49–397)

## 2019-03-27 MED ORDER — HYDROXYZINE HCL 25 MG PO TABS: ORAL_TABLET | ORAL | 0 refills | Status: DC

## 2019-03-27 MED ORDER — CYCLOBENZAPRINE HCL 10 MG PO TABS: 10.0000 mg | ORAL_TABLET | Freq: Two times a day (BID) | ORAL | 0 refills | Status: DC | PRN

## 2019-03-27 MED ORDER — METHYLPREDNISOLONE ACETATE 80 MG/ML IJ SUSP
80.0000 mg | Freq: Once | INTRAMUSCULAR | Status: AC
  Administered 2019-03-27: 18:00:00 80 mg via INTRAMUSCULAR

## 2019-03-27 MED ORDER — METHYLPREDNISOLONE ACETATE 80 MG/ML IJ SUSP
INTRAMUSCULAR | Status: AC
  Filled 2019-03-27: qty 1

## 2019-03-27 NOTE — Discharge Instructions (Addendum)
Please stop your cholesterol medication for now and set up a follow-up appointment with your PCP.  Hold off on using colchicine as well as where using a steroid injection to help you with your gout attack.  In general I would like for you to avoid nonsteroidal anti-inflammatories including medications like ibuprofen, Motrin, Aleve, naproxen and colchicine to name a few as you have significantly elevated blood pressure.  These kinds of medications called NSAIDs can pose a risk for your heart.  If you develop chest pain, heart racing, difficulty breathing, nausea with vomiting and belly pain then please report to the emergency room as this could be a sign that you are having a heart event such as heart attack.

## 2019-03-27 NOTE — ED Provider Notes (Signed)
MRN: [id number] DOB: [birth year]  Subjective:   Patrick Russell is a 48 y.o. male presenting for 2-day history of cute onset right foot pain, redness and swelling that is now severe and constant, throbbing.  Patient has a history of recurrent gout.  He also reports several day history of bilateral thigh and hamstring pain, has had difficulty sleeping at bedtime as a result.  Symptoms are worse over his left thigh.  Denies having history of blood clots, last chest CT scan was negative in 2018.  Denies active chest pain, shortness of breath, heart racing and palpitations, nausea, vomiting, belly pain, diaphoresis.  Patient has a history of difficult to control hypertension.  Diet is noncompliant, patient has morbid obesity and has had difficulty losing weight.  No current facility-administered medications for this encounter.   Current Outpatient Medications:  .  amLODipine (NORVASC) 10 MG tablet, Take 1 tablet (10 mg total) by mouth daily., Disp: 90 tablet, Rfl: 1 .  atorvastatin (LIPITOR) 40 MG tablet, Take 1 tablet (40 mg total) by mouth daily., Disp: 90 tablet, Rfl: 3 .  cephALEXin (KEFLEX) 500 MG capsule, Take 1 capsule (500 mg total) by mouth 4 (four) times daily., Disp: 40 capsule, Rfl: 0 .  Colchicine (MITIGARE) 0.6 MG CAPS, Take 2 tablets a the start of a gout attack. Take 1 more tablet an hour later. Do not repeat dosing for 2 days., Disp: 90 capsule, Rfl: 1 .  cyclobenzaprine (FLEXERIL) 10 MG tablet, TAKE 1 TABLET BY MOUTH AT BEDTIME FOR BACK SPASMS, Disp: , Rfl: 0 .  HYDROcodone-acetaminophen (NORCO/VICODIN) 5-325 MG tablet, Take 1 tablet by mouth every 6 (six) hours as needed for moderate pain or severe pain., Disp: 8 tablet, Rfl: 0 .  lisinopril (PRINIVIL,ZESTRIL) 20 MG tablet, Take 1 tablet (20 mg total) by mouth daily., Disp: 90 tablet, Rfl: 3 .  Magnesium 250 MG TABS, Take 500 mg by mouth daily., Disp: , Rfl:  .  meloxicam (MOBIC) 15 MG tablet, TAKE 1 TABLET BY MOUTH EVERY DAY, Disp: 30  tablet, Rfl: 0 .  methylPREDNISolone (MEDROL DOSEPAK) 4 MG TBPK tablet, 6 day dose pack - take as directed, Disp: 21 tablet, Rfl: 0 .  Omega-3 Fatty Acids (FISH OIL) 1200 MG CAPS, Take 1,200 mg by mouth 3 (three) times daily., Disp: , Rfl:  .  oxyCODONE-acetaminophen (PERCOCET/ROXICET) 5-325 MG tablet, Take 1-2 tablets by mouth every 6 (six) hours as needed for severe pain., Disp: 10 tablet, Rfl: 0 .  predniSONE (DELTASONE) 20 MG tablet, Day 1-5: Take 2 tablets daily with breakfast. Day 6-10: Take 1 tablet with breakfast., Disp: 15 tablet, Rfl: 0    No Known Allergies   Past Medical History:  Diagnosis Date  . Diabetes mellitus without complication (HCC)   . Gout    worse with Allopurinol  . Hypertension   . Obesity   . Subclinical hyperthyroidism   . Tinea pedis   . Tobacco use      History reviewed. No pertinent surgical history.   ROS  Objective:   Vitals: BP (!) 174/126 (BP Location: Left Arm)   Pulse (!) 102   Temp 99.2 F (37.3 C) (Oral)   Resp 17   SpO2 97%   Blood pressure recheck was 162/95 on left arm at 18:20, checked by [facility].  Physical Exam Constitutional:      General: He is in acute distress (From his pains).     Appearance: Normal appearance. He is well-developed. He is obese. He is not  ill-appearing, toxic-appearing or diaphoretic.  HENT:     Head: Normocephalic and atraumatic.     Right Ear: External ear normal.     Left Ear: External ear normal.     Nose: Nose normal.     Mouth/Throat:     Mouth: Mucous membranes are moist.     Pharynx: Oropharynx is clear.  Eyes:     General: No scleral icterus.    Extraocular Movements: Extraocular movements intact.     Pupils: Pupils are equal, round, and reactive to light.  Cardiovascular:     Rate and Rhythm: Normal rate and regular rhythm.     Heart sounds: Normal heart sounds. No murmur. No friction rub. No gallop.   Pulmonary:     Effort: Pulmonary effort is normal. No respiratory distress.      Breath sounds: Normal breath sounds. No stridor. No wheezing, rhonchi or rales.  Musculoskeletal:     Left upper leg: He exhibits tenderness (Along the area outlined). He exhibits no bony tenderness, no swelling, no edema, no deformity and no laceration.       Legs:       Feet:  Neurological:     Mental Status: He is alert and oriented to person, place, and time.  Psychiatric:        Mood and Affect: Mood normal.        Behavior: Behavior normal.        Thought Content: Thought content normal.      Assessment and Plan :   1. Acute gout of right foot, unspecified cause   2. Pain of left thigh   3. Myalgia   4. Dyslipidemia   5. Essential hypertension   6. Elevated blood pressure reading with diagnosis of hypertension   7. Morbid obesity (HCC)     We will use IM Depo-Medrol to address patient's current gout attack.  Advised that he stop using colchicine.  Stop atorvastatin and recheck with PCP about continuing this.  Will check CK level and basic metabolic panel.  Patient has low risk factors for DVT but counseled on signs and symptoms of this.  Provided patient with both Flexeril and hydroxyzine to help patient get some rest.  He is to follow-up with his PCP as soon as possible. Counseled patient on potential for adverse effects with medications prescribed/recommended today, ER and return-to-clinic precautions discussed, patient verbalized understanding.    Wallis Bamberg, [place] 03/27/19 1835

## 2019-03-27 NOTE — ED Triage Notes (Signed)
Pt presents with left leg pain and swelling; pt also presents with right foot pain and swelling X 2 days that is progressing.

## 2019-03-28 ENCOUNTER — Other Ambulatory Visit: Payer: Self-pay

## 2019-03-28 ENCOUNTER — Emergency Department (HOSPITAL_BASED_OUTPATIENT_CLINIC_OR_DEPARTMENT_OTHER): Payer: 59

## 2019-03-28 ENCOUNTER — Encounter (HOSPITAL_COMMUNITY): Payer: Self-pay | Admitting: Emergency Medicine

## 2019-03-28 ENCOUNTER — Emergency Department (HOSPITAL_COMMUNITY)
Admission: EM | Admit: 2019-03-28 | Discharge: 2019-03-28 | Disposition: A | Discharge status: Home / Self Care | Payer: 59 | Attending: Emergency Medicine | Admitting: Emergency Medicine

## 2019-03-28 DIAGNOSIS — M79609 Pain in unspecified limb: Secondary | ICD-10-CM | POA: Diagnosis not present

## 2019-03-28 DIAGNOSIS — I10 Essential (primary) hypertension: Secondary | ICD-10-CM | POA: Insufficient documentation

## 2019-03-28 DIAGNOSIS — Z87891 Personal history of nicotine dependence: Secondary | ICD-10-CM | POA: Insufficient documentation

## 2019-03-28 DIAGNOSIS — M7989 Other specified soft tissue disorders: Secondary | ICD-10-CM

## 2019-03-28 DIAGNOSIS — M109 Gout, unspecified: Secondary | ICD-10-CM | POA: Diagnosis not present

## 2019-03-28 DIAGNOSIS — M79671 Pain in right foot: Secondary | ICD-10-CM | POA: Diagnosis not present

## 2019-03-28 DIAGNOSIS — E119 Type 2 diabetes mellitus without complications: Secondary | ICD-10-CM | POA: Diagnosis not present

## 2019-03-28 DIAGNOSIS — Z79899 Other long term (current) drug therapy: Secondary | ICD-10-CM | POA: Diagnosis not present

## 2019-03-28 DIAGNOSIS — M79662 Pain in left lower leg: Secondary | ICD-10-CM | POA: Diagnosis present

## 2019-03-28 LAB — CBC
HCT: 45.3 % (ref 39.0–52.0)
Hemoglobin: 14.3 g/dL (ref 13.0–17.0)
MCH: 26.2 pg (ref 26.0–34.0)
MCHC: 31.6 g/dL (ref 30.0–36.0)
MCV: 83.1 fL (ref 80.0–100.0)
Platelets: 339 10*3/uL (ref 150–400)
RBC: 5.45 MIL/uL (ref 4.22–5.81)
RDW: 15.2 % (ref 11.5–15.5)
WBC: 11.1 10*3/uL — ABNORMAL HIGH (ref 4.0–10.5)
nRBC: 0 % (ref 0.0–0.2)

## 2019-03-28 LAB — BASIC METABOLIC PANEL
Anion gap: 14 (ref 5–15)
BUN: 7 mg/dL (ref 6–20)
CO2: 26 mmol/L (ref 22–32)
Calcium: 9.2 mg/dL (ref 8.9–10.3)
Chloride: 98 mmol/L (ref 98–111)
Creatinine, Ser: 0.74 mg/dL (ref 0.61–1.24)
GFR calc Af Amer: >60 mL/min (ref >60)
GFR calc non Af Amer: >60 mL/min (ref >60)
Glucose, Bld: 126 mg/dL — ABNORMAL HIGH (ref 70–99)
Potassium: 2.9 mmol/L — ABNORMAL LOW (ref 3.5–5.1)
Sodium: 138 mmol/L (ref 135–145)

## 2019-03-28 MED ORDER — HYDROMORPHONE HCL 1 MG/ML IJ SOLN
1.0000 mg | Freq: Once | INTRAMUSCULAR | Status: AC
  Administered 2019-03-28: 12:00:00 1 mg via INTRAMUSCULAR
  Filled 2019-03-28: qty 1

## 2019-03-28 MED ORDER — HYDROCODONE-ACETAMINOPHEN 5-325 MG PO TABS: 1.0000 | ORAL_TABLET | ORAL | 0 refills | Status: DC | PRN

## 2019-03-28 MED ORDER — NAPROXEN 500 MG PO TABS: 500.0000 mg | ORAL_TABLET | Freq: Two times a day (BID) | ORAL | 0 refills | Status: DC

## 2019-03-28 MED ORDER — PREDNISONE 50 MG PO TABS: 50.0000 mg | ORAL_TABLET | Freq: Every day | ORAL | 0 refills | Status: DC

## 2019-03-28 MED ORDER — HYDROCODONE-ACETAMINOPHEN 5-325 MG PO TABS
1.0000 | ORAL_TABLET | ORAL | Status: AC
  Administered 2019-03-28: 09:00:00 1 via ORAL
  Filled 2019-03-28: qty 1

## 2019-03-28 MED ORDER — POTASSIUM CHLORIDE CRYS ER 20 MEQ PO TBCR
40.0000 meq | EXTENDED_RELEASE_TABLET | Freq: Once | ORAL | Status: AC
  Administered 2019-03-28: 40 meq via ORAL
  Filled 2019-03-28: qty 2

## 2019-03-28 NOTE — ED Notes (Signed)
Discharge instructions and prescriptions discussed with Pt. Pt verbalized understanding. Pt stable and ambulatory with crutches. Kayla RN wheeled pt out to lobby

## 2019-03-28 NOTE — ED Provider Notes (Signed)
[facility] EMERGENCY DEPARTMENT Provider Note   CSN: [id number] Arrival date & time: 03/28/19  [id number]     History   Chief Complaint Chief Complaint  Patient presents with   Calf Pain    HPI Patrick Russell is a 48 y.o. male.     HPI Patient presented to the emergency room for evaluation of calf pain.  Patient states he was seen in urgent care yesterday for left leg pain and right foot pain.  Patient felt like he had a gout attack in his right foot/big toe.  This started a couple days prior.  At the urgent care he was given a dose of Solu-Medrol and told to stop his colchicine.  Patient states he is not having any relief with the treatment regimen he was given.  He is having persistent pain in his left knee and behind his knee.  Patient described pain in his calf although most of his discomfort is mostly in the popliteal fossa.  He denies any fevers or chills.  No chest pain or shortness of breath. Past Medical History:  Diagnosis Date   Diabetes mellitus without complication ([place])    Gout    worse with Allopurinol   Hypertension    Obesity    Subclinical hyperthyroidism    Tinea pedis    Tobacco use     Patient Active Problem List   Diagnosis Date Noted   Low TSH level 02/13/2017   Osteoarthritis of left knee 02/11/2017   Back pain 02/11/2017   Primary osteoarthritis of knees, bilateral 06/24/2016   Prediabetes 11/22/2015   Gout 11/01/2015   OSA (obstructive sleep apnea) 10/13/2015   Low testosterone 05/02/2015   Hypertension 07/16/2013   History of tobacco use 07/16/2013   Insomnia 07/16/2013   Obesity     History reviewed. No pertinent surgical history.      Home Medications    Prior to Admission medications   Medication Sig Start Date End Date Taking? Authorizing Provider  amLODipine (NORVASC) 10 MG tablet Take 1 tablet (10 mg total) by mouth daily. 01/10/18   Jaynee Eagles, PA-C  atorvastatin (LIPITOR) 40 MG tablet Take 1  tablet (40 mg total) by mouth daily. 01/10/18   Jaynee Eagles, PA-C  Colchicine (MITIGARE) 0.6 MG CAPS Take 2 tablets a the start of a gout attack. Take 1 more tablet an hour later. Do not repeat dosing for 2 days. 01/10/18   Jaynee Eagles, PA-C  cyclobenzaprine (FLEXERIL) 10 MG tablet TAKE 1 TABLET BY MOUTH AT BEDTIME FOR BACK SPASMS 01/26/18   [provider]  cyclobenzaprine (FLEXERIL) 10 MG tablet Take 1 tablet (10 mg total) by mouth 2 (two) times daily as needed for muscle spasms. 03/27/19   Jaynee Eagles, PA-C  HYDROcodone-acetaminophen (NORCO/VICODIN) 5-325 MG tablet Take 1 tablet by mouth every 6 (six) hours as needed for moderate pain or severe pain. 04/11/18   Vanessa Kick, MD  hydrOXYzine (ATARAX/VISTARIL) 25 MG tablet Take 1-2 tablets at bedtime for insomnia as needed. 03/27/19   Jaynee Eagles, PA-C  lisinopril (PRINIVIL,ZESTRIL) 20 MG tablet Take 1 tablet (20 mg total) by mouth daily. 01/10/18   Jaynee Eagles, PA-C  Magnesium 250 MG TABS Take 500 mg by mouth daily.    [provider]  meloxicam (MOBIC) 15 MG tablet TAKE 1 TABLET BY MOUTH EVERY DAY 11/02/18   Edrick Kins, DPM  methylPREDNISolone (MEDROL DOSEPAK) 4 MG TBPK tablet 6 day dose pack - take as directed 08/08/18   Amalia Hailey,  Dorathy Daft, DPM  Omega-3 Fatty Acids (FISH OIL) 1200 MG CAPS Take 1,200 mg by mouth 3 (three) times daily.    [provider]  oxyCODONE-acetaminophen (PERCOCET/ROXICET) 5-325 MG tablet Take 1-2 tablets by mouth every 6 (six) hours as needed for severe pain. 04/13/18   Bast, Tressia Miners A, NP  predniSONE (DELTASONE) 20 MG tablet Day 1-5: Take 2 tablets daily with breakfast. Day 6-10: Take 1 tablet with breakfast. 02/15/18   Jaynee Eagles, PA-C    Family History Family History  Problem Relation Age of Onset   Hypertension Mother    Diabetes Mother        controlled with weight loss   Cancer Father        prostate   Diabetes Father        controlled with weight loss   Hypertension Father    Diabetes  Brother    Hypertension Brother     Social History Social History   Tobacco Use   Smoking status: Former Smoker    Packs/day: 0.50    Types: Cigarettes    Quit date: 07/07/2013    Years since quitting: 5.7   Smokeless tobacco: Never Used   Tobacco comment: Quit 2014  Substance Use Topics   Alcohol use: Yes    Alcohol/week: 0.0 standard drinks    Comment: weekends only: some beer, a couple of shots   Drug use: No     Allergies   Patient has no known allergies.   Review of Systems Review of Systems  All other systems reviewed and are negative.    Physical Exam Updated Vital Signs BP (!) 142/83    Pulse 81    Temp 98.6 F (37 C) (Oral)    Resp 20    Ht 1.727 m (5\' 8" )    Wt (!) 143.8 kg    SpO2 95%    BMI 48.20 kg/m   Physical Exam Vitals signs and nursing note reviewed.  Constitutional:      General: He is not in acute distress.    Appearance: He is well-developed. He is obese.  HENT:     Head: Normocephalic and atraumatic.     Right Ear: External ear normal.     Left Ear: External ear normal.  Eyes:     General: No scleral icterus.       Right eye: No discharge.        Left eye: No discharge.     Conjunctiva/sclera: Conjunctivae normal.  Neck:     Musculoskeletal: Neck supple.     Trachea: No tracheal deviation.  Cardiovascular:     Rate and Rhythm: Normal rate.  Pulmonary:     Effort: Pulmonary effort is normal. No respiratory distress.     Breath sounds: No stridor.  Abdominal:     General: There is no distension.  Musculoskeletal:        General: No swelling or deformity.     Left knee: He exhibits swelling. Tenderness found.     Left lower leg: He exhibits tenderness.     Comments: Erythema big toe of the right foot  Skin:    General: Skin is warm and dry.     Findings: No rash.  Neurological:     Mental Status: He is alert.     Cranial Nerves: Cranial nerve deficit: no gross deficits.      ED Treatments / Results  Labs (all labs  ordered are listed, but only abnormal results are displayed) Labs Reviewed  CBC - Abnormal; Notable for the following components:      Result Value   WBC 11.1 (*)    All other components within normal limits  BASIC METABOLIC PANEL - Abnormal; Notable for the following components:   Potassium 2.9 (*)    Glucose, Bld 126 (*)    All other components within normal limits    EKG None  Radiology Vas [place] Lower Extremity Venous (dvt) (mc And Wl 7a-7p)  Result Date: 03/28/2019  Lower Venous Study Indications: Pain, and Swelling.  Comparison Study: no prior Performing Technologist: June Leap RDMS, RVT  Examination Guidelines: A complete evaluation includes B-mode imaging, spectral Doppler, color Doppler, and power Doppler as needed of all accessible portions of each vessel. Bilateral testing is considered an integral part of a complete examination. Limited examinations for reoccurring indications may be performed as noted.  +-----+---------------+---------+-----------+----------+--------------+  RIGHT Compressibility Phasicity Spontaneity Properties                 +-----+---------------+---------+-----------+----------+--------------+  CFV                                                    Not visualized  +-----+---------------+---------+-----------+----------+--------------+   +---------+---------------+---------+-----------+----------+--------------+  LEFT      Compressibility Phasicity Spontaneity Properties Thrombus Aging  +---------+---------------+---------+-----------+----------+--------------+  CFV       Full            Yes       Yes                                    +---------+---------------+---------+-----------+----------+--------------+  SFJ       Full                                                             +---------+---------------+---------+-----------+----------+--------------+  FV Prox   Full                                                              +---------+---------------+---------+-----------+----------+--------------+  FV Mid    Full                                                             +---------+---------------+---------+-----------+----------+--------------+  FV Distal Full                                                             +---------+---------------+---------+-----------+----------+--------------+  PFV       Full                                                             +---------+---------------+---------+-----------+----------+--------------+  POP       Full            Yes       Yes                                    +---------+---------------+---------+-----------+----------+--------------+  PTV       Full                                                             +---------+---------------+---------+-----------+----------+--------------+  PERO      Full                                                             +---------+---------------+---------+-----------+----------+--------------+     Summary: Right: No evidence of common femoral vein obstruction. Left: There is no evidence of deep vein thrombosis in the lower extremity. A cystic structure is found in the popliteal fossa.  *See table(s) above for measurements and observations.    Preliminary     Procedures Procedures (including critical care time)  Medications Ordered in ED Medications  HYDROmorphone (DILAUDID) injection 1 mg (has no administration in time range)  HYDROcodone-acetaminophen (NORCO/VICODIN) 5-325 MG per tablet 1 tablet (1 tablet Oral Given 03/28/19 [id number])     Initial Impression / Assessment and Plan / ED Course  I have reviewed the triage vital signs and the nursing notes.  Pertinent labs & imaging results that were available during my care of the patient were reviewed by me and considered in my medical decision making (see chart for details).   Patient's labs showed mild leukocytosis.  Potassium level was slightly decreased at 2.9.  Doppler  study does not show evidence of DVT.  I suspect the patient is having a gouty arthritis attack in his left knee.  Physical exam is suggestive of mild effusion.  Patient does not have any erythema.  I doubt septic arthritis.  He was given a Solu-Medrol injection.  I will have him resume his colchicine and do steroids as well as pain medications.  Recommend outpatient follow-up with primary care doctor orthopedics.  Warning signs precautions discussed.  Final Clinical Impressions(s) / ED Diagnoses   Final diagnoses:  Gout, arthritis    ED Discharge Orders    None       Dorie Rank, MD 03/28/19 1057

## 2019-03-28 NOTE — Progress Notes (Signed)
LLE venous duplex       has been completed. Preliminary results can be found under CV proc through chart review. Jerrid Forgette, BS, RDMS, RVT    

## 2019-03-28 NOTE — ED Triage Notes (Signed)
Patient reports left calf pain radiating to left posterior thigh for 3 days , denies injury or long trips , uses crutches to ambulate , distal pulses present . Patient seen at an urgent care yesterday diagnosed with gout and myalgia .

## 2019-03-28 NOTE — Discharge Instructions (Signed)
Take the medications as prescribed, follow-up with your doctor to make sure your symptoms are improving.  Return to the ED for fevers chills or other concerning symptoms

## 2019-04-07 ENCOUNTER — Other Ambulatory Visit: Payer: Self-pay

## 2019-04-07 ENCOUNTER — Emergency Department (HOSPITAL_COMMUNITY)
Admission: EM | Admit: 2019-04-07 | Discharge: 2019-04-07 | Disposition: A | Discharge status: Home / Self Care | Payer: 59 | Attending: Emergency Medicine | Admitting: Emergency Medicine

## 2019-04-07 ENCOUNTER — Encounter (HOSPITAL_COMMUNITY): Payer: Self-pay

## 2019-04-07 DIAGNOSIS — Z79899 Other long term (current) drug therapy: Secondary | ICD-10-CM | POA: Insufficient documentation

## 2019-04-07 DIAGNOSIS — Z87891 Personal history of nicotine dependence: Secondary | ICD-10-CM | POA: Insufficient documentation

## 2019-04-07 DIAGNOSIS — M5442 Lumbago with sciatica, left side: Secondary | ICD-10-CM | POA: Insufficient documentation

## 2019-04-07 DIAGNOSIS — M5432 Sciatica, left side: Secondary | ICD-10-CM

## 2019-04-07 DIAGNOSIS — E119 Type 2 diabetes mellitus without complications: Secondary | ICD-10-CM | POA: Insufficient documentation

## 2019-04-07 DIAGNOSIS — M545 Low back pain: Secondary | ICD-10-CM | POA: Diagnosis present

## 2019-04-07 DIAGNOSIS — I10 Essential (primary) hypertension: Secondary | ICD-10-CM | POA: Insufficient documentation

## 2019-04-07 MED ORDER — OXYCODONE-ACETAMINOPHEN 5-325 MG PO TABS
1.0000 | ORAL_TABLET | Freq: Once | ORAL | Status: AC
  Administered 2019-04-07: 15:00:00 1 via ORAL
  Filled 2019-04-07: qty 1

## 2019-04-07 MED ORDER — METHYLPREDNISOLONE 4 MG PO TBPK: ORAL_TABLET | ORAL | 0 refills | Status: DC

## 2019-04-07 MED ORDER — METHOCARBAMOL 500 MG PO TABS: 500.0000 mg | ORAL_TABLET | Freq: Two times a day (BID) | ORAL | 0 refills | Status: DC

## 2019-04-07 NOTE — ED Provider Notes (Signed)
[facility]GENC[facility] DEPART[facility] Provider Note   CSN: [id number] Arrival date & time: 04/07/19  1251     History   Chief Complaint Chief Complaint  Patient presents with   Back Pain    HPI Patrick Russell is a 48 y.o. male who presents to ED for evaluation of 3-day history of left lower back pain rating down his left leg.  Describes the pain as sharp, associated paresthesias.  Reports history of similar symptoms in the past when he was injured at his job about 5 months ago.  States that the symptoms resolved.  He cannot recall any inciting event that may have triggered the symptoms.  States that "I googled my symptoms and they said it was something about my sciatic nerve."  He has tried [employer] and generic muscle pain medication with only mild improvement in symptoms.  Denies any numbness in arms or legs, injuries or falls, loss of bowel or bladder function, prior back surgery, history of cancer, she of IV drug use, dysuria, chest pain or abdominal pain.     HPI  Past Medical History:  Diagnosis Date   Diabetes mellitus without complication ([place])    Gout    worse with Allopurinol   Hypertension    Obesity    Subclinical hyperthyroidism    Tinea pedis    Tobacco use     Patient Active Problem List   Diagnosis Date Noted   Low TSH level 02/13/2017   Osteoarthritis of left knee 02/11/2017   Back pain 02/11/2017   Primary osteoarthritis of knees, bilateral 06/24/2016   Prediabetes 11/22/2015   Gout 11/01/2015   OSA (obstructive sleep apnea) 10/13/2015   Low testosterone 05/02/2015   Hypertension 07/16/2013   History of tobacco use 07/16/2013   Insomnia 07/16/2013   Obesity     History reviewed. No pertinent surgical history.      Home Medications    Prior to Admission medications   Medication Sig Start Date End Date Taking? Authorizing Provider  amLODipine (NORVASC) 10 MG tablet Take 1 tablet (10 mg total) by mouth daily.  01/10/18   Jaynee Eagles, PA-C  atorvastatin (LIPITOR) 40 MG tablet Take 1 tablet (40 mg total) by mouth daily. 01/10/18   Jaynee Eagles, PA-C  Colchicine (MITIGARE) 0.6 MG CAPS Take 2 tablets a the start of a gout attack. Take 1 more tablet an hour later. Do not repeat dosing for 2 days. 01/10/18   Jaynee Eagles, PA-C  cyclobenzaprine (FLEXERIL) 10 MG tablet TAKE 1 TABLET BY MOUTH AT BEDTIME FOR BACK SPASMS 01/26/18   [provider]  cyclobenzaprine (FLEXERIL) 10 MG tablet Take 1 tablet (10 mg total) by mouth 2 (two) times daily as needed for muscle spasms. 03/27/19   Jaynee Eagles, PA-C  HYDROcodone-acetaminophen (NORCO/VICODIN) 5-325 MG tablet Take 1 tablet by mouth every 4 (four) hours as needed. 03/28/19   Dorie Rank, MD  hydrOXYzine (ATARAX/VISTARIL) 25 MG tablet Take 1-2 tablets at bedtime for insomnia as needed. 03/27/19   Jaynee Eagles, PA-C  lisinopril (PRINIVIL,ZESTRIL) 20 MG tablet Take 1 tablet (20 mg total) by mouth daily. 01/10/18   Jaynee Eagles, PA-C  Magnesium 250 MG TABS Take 500 mg by mouth daily.    [provider]  meloxicam (MOBIC) 15 MG tablet TAKE 1 TABLET BY MOUTH EVERY DAY 11/02/18   Edrick Kins, DPM  methocarbamol (ROBAXIN) 500 MG tablet Take 1 tablet (500 mg total) by mouth 2 (two) times daily. 04/07/19   Lilburn Straw,  Babbette Dalesandro, PA-C  methylPREDNISolone (MEDROL DOSEPAK) 4 MG TBPK tablet Taper over 6 days. 04/07/19   Normajean Nash, PA-C  naproxen (NAPROSYN) 500 MG tablet Take 1 tablet (500 mg total) by mouth 2 (two) times daily with a meal. As needed for pain 03/28/19   Dorie Rank, MD  Omega-3 Fatty Acids (FISH OIL) 1200 MG CAPS Take 1,200 mg by mouth 3 (three) times daily.    [provider]  oxyCODONE-acetaminophen (PERCOCET/ROXICET) 5-325 MG tablet Take 1-2 tablets by mouth every 6 (six) hours as needed for severe pain. 04/13/18   Loura Halt A, NP  predniSONE (DELTASONE) 50 MG tablet Take 1 tablet (50 mg total) by mouth daily. 03/28/19   Dorie Rank, MD    Family  History Family History  Problem Relation Age of Onset   Hypertension Mother    Diabetes Mother        controlled with weight loss   Cancer Father        prostate   Diabetes Father        controlled with weight loss   Hypertension Father    Diabetes Brother    Hypertension Brother     Social History Social History   Tobacco Use   Smoking status: Former Smoker    Packs/day: 0.50    Types: Cigarettes    Quit date: 07/07/2013    Years since quitting: 5.7   Smokeless tobacco: Never Used   Tobacco comment: Quit 2014  Substance Use Topics   Alcohol use: Yes    Alcohol/week: 0.0 standard drinks    Comment: weekends only: some beer, a couple of shots   Drug use: No     Allergies   Patient has no known allergies.   Review of Systems Review of Systems  Constitutional: Negative for chills and fever.  Cardiovascular: Negative for chest pain.  Musculoskeletal: Positive for back pain and myalgias.  Neurological: Negative for weakness and numbness.     Physical Exam Updated Vital Signs BP (!) 144/87    Pulse 88    Temp 98.5 F (36.9 C) (Oral)    Resp 18    SpO2 99%   Physical Exam Vitals signs and nursing note reviewed.  Constitutional:      General: He is not in acute distress.    Appearance: He is well-developed. He is not diaphoretic.  HENT:     Head: Normocephalic and atraumatic.  Eyes:     General: No scleral icterus.    Conjunctiva/sclera: Conjunctivae normal.  Neck:     Musculoskeletal: Normal range of motion.  Cardiovascular:     Rate and Rhythm: Normal rate and regular rhythm.     Heart sounds: Normal heart sounds.  Pulmonary:     Effort: Pulmonary effort is normal. No respiratory distress.     Breath sounds: Normal breath sounds.  Musculoskeletal:       Back:     Comments: Tenderness to palpation of the indicated area of the left paraspinal musculature of the lumbar spine without midline tenderness. No step-off palpated. No visible  bruising, edema or temperature change noted. No objective signs of numbness present. No saddle anesthesia. 2+ DP pulses bilaterally. Sensation intact to light touch. Strength 5/5 in bilateral lower extremities.  Skin:    Findings: No rash.  Neurological:     Mental Status: He is alert.      ED Treatments / Results  Labs (all labs ordered are listed, but only abnormal results are displayed) Labs Reviewed - No  data to display  EKG None  Radiology No results found.  Procedures Procedures (including critical care time)  Medications Ordered in ED Medications  oxyCODONE-acetaminophen (PERCOCET/ROXICET) 5-325 MG per tablet 1 tablet (has no administration in time range)     Initial Impression / Assessment and Plan / ED Course  I have reviewed the triage vital signs and the nursing notes.  Pertinent labs & imaging results that were available during my care of the patient were reviewed by me and considered in my medical decision making (see chart for details).        Patient denies any concerning symptoms suggestive of cauda equina requiring urgent imaging at this time such as loss of sensation in the lower extremities, lower extremity weakness, loss of bowel or bladder control, saddle anesthesia, urinary retention, fever/chills, IVDU. Exam demonstrated no  weakness on exam today. No preceding injury or trauma to suggest acute fracture. Doubt pelvic or urinary pathology for patient's acute back pain, as patient denies urinary symptoms. Doubt AAA as cause of patient's back pain as patient lacks major risk factors, had no abdominal TTP, and has symmetric and intact distal pulses.  Suspect that symptoms are due to sciatica.  He remains ambulatory.  Will discharge home with steroid Dosepak, muscle relaxer and PCP follow-up.  Patient given strict return precautions for any symptoms indicating worsening neurologic function in the lower extremities.  Patient is hemodynamically stable, in NAD,  and able to ambulate in the ED. Evaluation does not show pathology that would require ongoing emergent intervention or inpatient treatment. I explained the diagnosis to the patient. Pain has been managed and has no complaints prior to discharge. Patient is comfortable with above plan and is stable for discharge at this time. All questions were answered prior to disposition. Strict return precautions for returning to the ED were discussed. Encouraged follow up with PCP.   An After Visit Summary was printed and given to the patient.   Portions of this note were generated with [employer]. Dictation errors may occur despite best attempts at proofreading.  Final Clinical Impressions(s) / ED Diagnoses   Final diagnoses:  Sciatica of left side    ED Discharge Orders         Ordered    methylPREDNISolone (MEDROL DOSEPAK) 4 MG TBPK tablet  Status:  Discontinued     04/07/19 1503    methocarbamol (ROBAXIN) 500 MG tablet  2 times daily     04/07/19 1503    methylPREDNISolone (MEDROL DOSEPAK) 4 MG TBPK tablet     04/07/19 1503           Delia Heady, PA-C 04/07/19 1504    Carmin Muskrat, MD 04/08/19 1151

## 2019-04-07 NOTE — ED Triage Notes (Signed)
Patient complains of lower back pain with pain radiating down left leg for a few days, reports remote hx of back problems, no distress

## 2019-04-07 NOTE — Discharge Instructions (Addendum)
Take the medication as prescribed. Return to the ED if you start to experience worsening symptoms, chest pain, leg swelling, losing control of your bowels or bladder.

## 2019-12-30 ENCOUNTER — Ambulatory Visit (INDEPENDENT_AMBULATORY_CARE_PROVIDER_SITE_OTHER): Payer: 59

## 2019-12-30 ENCOUNTER — Encounter (HOSPITAL_COMMUNITY): Payer: Self-pay | Admitting: Emergency Medicine

## 2019-12-30 ENCOUNTER — Other Ambulatory Visit: Payer: Self-pay

## 2019-12-30 ENCOUNTER — Ambulatory Visit (HOSPITAL_COMMUNITY)
Admission: EM | Admit: 2019-12-30 | Discharge: 2019-12-30 | Disposition: A | Discharge status: Home / Self Care | Payer: 59 | Attending: Emergency Medicine | Admitting: Emergency Medicine

## 2019-12-30 DIAGNOSIS — R0602 Shortness of breath: Secondary | ICD-10-CM

## 2019-12-30 DIAGNOSIS — U071 COVID-19: Secondary | ICD-10-CM | POA: Insufficient documentation

## 2019-12-30 DIAGNOSIS — R509 Fever, unspecified: Secondary | ICD-10-CM | POA: Insufficient documentation

## 2019-12-30 DIAGNOSIS — R05 Cough: Secondary | ICD-10-CM | POA: Diagnosis not present

## 2019-12-30 DIAGNOSIS — R062 Wheezing: Secondary | ICD-10-CM

## 2019-12-30 DIAGNOSIS — Z20822 Contact with and (suspected) exposure to covid-19: Secondary | ICD-10-CM

## 2019-12-30 LAB — SARS CORONAVIRUS 2 (TAT 6-24 HRS): SARS Coronavirus 2: POSITIVE — AB

## 2019-12-30 MED ORDER — ALBUTEROL SULFATE HFA 108 (90 BASE) MCG/ACT IN AERS: 1.0000 | INHALATION_SPRAY | Freq: Four times a day (QID) | RESPIRATORY_TRACT | 0 refills | Status: DC | PRN

## 2019-12-30 MED ORDER — METHYLPREDNISOLONE SODIUM SUCC 125 MG IJ SOLR
125.0000 mg | Freq: Once | INTRAMUSCULAR | Status: AC
  Administered 2019-12-30: 125 mg via INTRAMUSCULAR

## 2019-12-30 MED ORDER — PREDNISONE 10 MG (21) PO TBPK
ORAL_TABLET | Freq: Every day | ORAL | 0 refills | Status: DC
Start: 2019-12-30 — End: 2020-08-12

## 2019-12-30 MED ORDER — METHYLPREDNISOLONE SODIUM SUCC 125 MG IJ SOLR
INTRAMUSCULAR | Status: AC
  Filled 2019-12-30: qty 2

## 2019-12-30 NOTE — ED Triage Notes (Signed)
Pt c/o SOB, chills, dizziness, fever onset Friday night. Pt states he has been taking Halls and mucinex with some relief. Pt states he had a fever of 101 this morning.

## 2019-12-30 NOTE — Discharge Instructions (Addendum)
If you become more shortness of breath you need to go to the ER  Check you my chart for results with in the next 6-12 hours  Stay hydrated  Take medications  Your chest x ray was normal

## 2019-12-30 NOTE — ED Provider Notes (Signed)
[facility]    CSN: [id number] Arrival date & time: 12/30/19  0801      History   Chief Complaint Chief Complaint  Patient presents with   Fever   Shortness of Breath    HPI Patrick Russell is a 49 y.o. male.   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 Pt has had a vaccine this year. States that he has been taking mucinex and other sinus medications otc but symptoms are becoming worse. States that he is coughing up thick mucus now. Denies any chest pain just more of the sob that is bothering him. Took bp meds this am hx of this      Past Medical History:  Diagnosis Date   Diabetes mellitus without complication ([place])    Gout    worse with Allopurinol   Hypertension    Obesity    Subclinical hyperthyroidism    Tinea pedis    Tobacco use     Patient Active Problem List   Diagnosis Date Noted   Low TSH level 02/13/2017   Osteoarthritis of left knee 02/11/2017   Back pain 02/11/2017   Primary osteoarthritis of knees, bilateral 06/24/2016   Prediabetes 11/22/2015   Gout 11/01/2015   OSA (obstructive sleep apnea) 10/13/2015   Low testosterone 05/02/2015   Hypertension 07/16/2013   History of tobacco use 07/16/2013   Insomnia 07/16/2013   Obesity     History reviewed. No pertinent surgical history.     Home Medications    Prior to Admission medications   Medication Sig Start Date End Date Taking? Authorizing Provider  albuterol (VENTOLIN HFA) 108 (90 Base) MCG/ACT inhaler Inhale 1-2 puffs into the lungs every 6 (six) hours as needed for wheezing or shortness of breath. 12/30/19   Marney Setting, NP  amLODipine (NORVASC) 10 MG tablet Take 1 tablet (10 mg total) by mouth daily. 01/10/18   Jaynee Eagles, PA-C  atorvastatin (LIPITOR) 40 MG tablet Take 1 tablet (40 mg total) by mouth daily. 01/10/18   Jaynee Eagles, PA-C  Colchicine (MITIGARE) 0.6 MG  CAPS Take 2 tablets a the start of a gout attack. Take 1 more tablet an hour later. Do not repeat dosing for 2 days. 01/10/18   Jaynee Eagles, PA-C  cyclobenzaprine (FLEXERIL) 10 MG tablet TAKE 1 TABLET BY MOUTH AT BEDTIME FOR BACK SPASMS 01/26/18   [provider]  cyclobenzaprine (FLEXERIL) 10 MG tablet Take 1 tablet (10 mg total) by mouth 2 (two) times daily as needed for muscle spasms. 03/27/19   Jaynee Eagles, PA-C  HYDROcodone-acetaminophen (NORCO/VICODIN) 5-325 MG tablet Take 1 tablet by mouth every 4 (four) hours as needed. 03/28/19   Dorie Rank, MD  hydrOXYzine (ATARAX/VISTARIL) 25 MG tablet Take 1-2 tablets at bedtime for insomnia as needed. 03/27/19   Jaynee Eagles, PA-C  lisinopril (PRINIVIL,ZESTRIL) 20 MG tablet Take 1 tablet (20 mg total) by mouth daily. 01/10/18   Jaynee Eagles, PA-C  Magnesium 250 MG TABS Take 500 mg by mouth daily.    [provider]  meloxicam (MOBIC) 15 MG tablet TAKE 1 TABLET BY MOUTH EVERY DAY 11/02/18   Edrick Kins, DPM  methocarbamol (ROBAXIN) 500 MG tablet Take 1 tablet (500 mg total) by mouth 2 (two) times daily. 04/07/19   Khatri, Hina, PA-C  methylPREDNISolone (MEDROL DOSEPAK) 4 MG TBPK tablet Taper over 6 days. 04/07/19   Khatri,  Hina, PA-C  naproxen (NAPROSYN) 500 MG tablet Take 1 tablet (500 mg total) by mouth 2 (two) times daily with a meal. As needed for pain 03/28/19   Dorie Rank, MD  Omega-3 Fatty Acids (FISH OIL) 1200 MG CAPS Take 1,200 mg by mouth 3 (three) times daily.    [provider]  oxyCODONE-acetaminophen (PERCOCET/ROXICET) 5-325 MG tablet Take 1-2 tablets by mouth every 6 (six) hours as needed for severe pain. 04/13/18   Bast, Tressia Miners A, NP  predniSONE (STERAPRED UNI-PAK 21 TAB) 10 MG (21) TBPK tablet Take by mouth daily. Take 6 tabs by mouth daily  for 2 days, then 5 tabs for 2 days, then 4 tabs for 2 days, then 3 tabs for 2 days, 2 tabs for 2 days, then 1 tab by mouth daily for 2 days 12/30/19   Marney Setting, NP     Family History Family History  Problem Relation Age of Onset   Hypertension Mother    Diabetes Mother        controlled with weight loss   Cancer Father        prostate   Diabetes Father        controlled with weight loss   Hypertension Father    Diabetes Brother    Hypertension Brother     Social History Social History   Tobacco Use   Smoking status: Former Smoker    Packs/day: 0.50    Types: Cigarettes    Quit date: 07/07/2013    Years since quitting: 6.4   Smokeless tobacco: Never Used   Tobacco comment: Quit 2014  Vaping Use   Vaping Use: Never used  Substance Use Topics   Alcohol use: Yes    Alcohol/week: 0.0 standard drinks    Comment: weekends only: some beer, a couple of shots   Drug use: No     Allergies   Patient has no known allergies.   Review of Systems Review of Systems  Constitutional: Positive for appetite change, chills, fatigue and fever.  HENT: Negative for postnasal drip, rhinorrhea, sinus pressure, sinus pain, sneezing and sore throat.   Respiratory: Positive for cough, shortness of breath and wheezing.   Cardiovascular: Negative.   Gastrointestinal: Negative.   Genitourinary: Negative.   Neurological: Negative.      Physical Exam Triage Vital Signs ED Triage Vitals  Enc Vitals Group     BP 12/30/19 0823 (!) 154/101     Pulse Rate 12/30/19 0823 (!) 113     Resp 12/30/19 0823 18     Temp 12/30/19 0823 99.7 F (37.6 C)     Temp Source 12/30/19 0823 Oral     SpO2 12/30/19 0823 98 %     Weight --      Height --      Head Circumference --      Peak Flow --      Pain Score 12/30/19 0820 0     Pain Loc --      Pain Edu? --      Excl. in [place]? --    No data found.  Updated Vital Signs BP (!) 154/101 (BP Location: Left Arm)    Pulse (!) 113    Temp 99.7 F (37.6 C) (Oral)    Resp 18    SpO2 98%   Visual Acuity     Physical Exam Constitutional:      Appearance: He is obese. He is ill-appearing.  HENT:      Head: Normocephalic.  Mouth/Throat:     Mouth: Mucous membranes are moist.  Cardiovascular:     Rate and Rhythm: Tachycardia present.  Pulmonary:     Effort: Tachypnea present.     Breath sounds: Examination of the left-upper field reveals wheezing. Examination of the left-middle field reveals wheezing. Examination of the left-lower field reveals wheezing. Wheezing present.  Chest:     Comments: wnl  Abdominal:     Palpations: Abdomen is soft.  Musculoskeletal:        General: Normal range of motion.  Skin:    Capillary Refill: Capillary refill takes less than 2 seconds.     Comments: Hot to touch       UC Treatments / Results  Labs (all labs ordered are listed, but only abnormal results are displayed) Labs Reviewed  SARS CORONAVIRUS 2 (TAT 6-24 HRS)    EKG   Radiology DG Chest 2 View  Result Date: 12/30/2019 CLINICAL DATA:  Shortness of breath with cough and wheezing EXAM: CHEST - 2 VIEW COMPARISON:  January 17, 2017 FINDINGS: Lungs are clear. Heart size and pulmonary vascularity are normal. No adenopathy. No bone lesions. IMPRESSION: Lungs clear.  Cardiac silhouette within normal limits. Electronically Signed   By: Lowella Grip III M.D.   On: 12/30/2019 08:44    Procedures Procedures (including critical care time)  Medications Ordered in UC Medications  methylPREDNISolone sodium succinate (SOLU-MEDROL) 125 mg/2 mL injection 125 mg (125 mg Intramuscular Given 12/30/19 0832)    Initial Impression / Assessment and Plan / UC Course  I have reviewed the triage vital signs and the nursing notes.  Pertinent labs & imaging results that were available during my care of the patient were reviewed by me and considered in my medical decision making (see chart for details).    Avoid going to work until all test results return  Chest my chart results Stay hydrated  If symptoms become worse go to the ER  Final Clinical Impressions(s) / UC Diagnoses   Final diagnoses:   Shortness of breath  Fever, unspecified  Wheezing  Contact with and (suspected) exposure to covid-19     Discharge Instructions     If you become more shortness of breath you need to go to the ER  Check you my chart for results with in the next 6-12 hours  Stay hydrated  Take medications  Your chest x ray was normal       ED Prescriptions    Medication Sig Dispense Auth. Provider   albuterol (VENTOLIN HFA) 108 (90 Base) MCG/ACT inhaler Inhale 1-2 puffs into the lungs every 6 (six) hours as needed for wheezing or shortness of breath. 10 g Marney Setting, NP   predniSONE (STERAPRED UNI-PAK 21 TAB) 10 MG (21) TBPK tablet Take by mouth daily. Take 6 tabs by mouth daily  for 2 days, then 5 tabs for 2 days, then 4 tabs for 2 days, then 3 tabs for 2 days, 2 tabs for 2 days, then 1 tab by mouth daily for 2 days 42 tablet Marney Setting, NP     PDMP not reviewed this encounter.   Marney Setting, NP 12/30/19 [phone]

## 2019-12-31 ENCOUNTER — Telehealth: Payer: Self-pay | Admitting: Physician Assistant

## 2019-12-31 NOTE — Telephone Encounter (Signed)
Called to discuss with Patrick Russell about Covid symptoms and the use of bamlanivimab/etesevimab or casirivimab/imdevimab, a monoclonal antibody infusion for those with mild to moderate Covid symptoms and at a high risk of hospitalization.     Pt is qualified for this infusion at the monoclonal antibody infusion center due to co-morbid conditions and/or a member of an at-risk group, however would like to think about the infusion at this time. Symptoms tier reviewed as well as criteria for ending isolation.  Symptoms reviewed that would warrant ED/Hospital evaluation. Preventative practices reviewed. Patient verbalized understanding. Patient advised to call back if he decides that he does want to get infusion. Callback number to the infusion center given. Patient advised to go to Urgent care or ED with severe symptoms. Last date pt would be eligible for infusion is 7/24. He is fully vaccinated.   Patient Active Problem List   Diagnosis Date Noted  . Low TSH level 02/13/2017  . Osteoarthritis of left knee 02/11/2017  . Back pain 02/11/2017  . Primary osteoarthritis of knees, bilateral 06/24/2016  . Prediabetes 11/22/2015  . Gout 11/01/2015  . OSA (obstructive sleep apnea) 10/13/2015  . Low testosterone 05/02/2015  . Hypertension 07/16/2013  . History of tobacco use 07/16/2013  . Insomnia 07/16/2013  . Obesity     Angelena Form PA-C

## 2020-06-29 ENCOUNTER — Telehealth: Payer: Self-pay | Admitting: *Deleted

## 2020-06-29 NOTE — Telephone Encounter (Signed)
Called patient. No answer, left a message for him to call us back to make OV. Colon and PV cancelled.

## 2020-06-29 NOTE — Telephone Encounter (Signed)
Office visit

## 2020-06-29 NOTE — Telephone Encounter (Signed)
Office visit was scheduled with the pt.

## 2020-06-29 NOTE — Telephone Encounter (Signed)
Dr.Perry,  This patient is a direct screening colonoscopy. Per his last PCP records his BMI=52.15 on 05/20/2020. H/o sleep apnea, smoker, HTN, DM & Gout. Would you like the patient to have an office visit or direct colon at hospital? Please advise. Thank you, Khaidyn Staebell pv

## 2020-07-28 ENCOUNTER — Encounter: Payer: 59 | Admitting: Internal Medicine

## 2020-08-12 ENCOUNTER — Encounter: Payer: Self-pay | Admitting: Internal Medicine

## 2020-08-12 ENCOUNTER — Ambulatory Visit (INDEPENDENT_AMBULATORY_CARE_PROVIDER_SITE_OTHER): Payer: 59 | Admitting: Internal Medicine

## 2020-08-12 VITALS — BP 128/88 | HR 97 | Ht 68.0 in | Wt 340.2 lb

## 2020-08-12 DIAGNOSIS — K219 Gastro-esophageal reflux disease without esophagitis: Secondary | ICD-10-CM | POA: Diagnosis not present

## 2020-08-12 DIAGNOSIS — R7989 Other specified abnormal findings of blood chemistry: Secondary | ICD-10-CM | POA: Diagnosis not present

## 2020-08-12 DIAGNOSIS — Z1211 Encounter for screening for malignant neoplasm of colon: Secondary | ICD-10-CM | POA: Diagnosis not present

## 2020-08-12 MED ORDER — SUTAB 1479-225-188 MG PO TABS
1.0000 | ORAL_TABLET | Freq: Once | ORAL | 0 refills | Status: AC
Start: 2020-08-12 — End: 2020-08-12

## 2020-08-12 NOTE — Patient Instructions (Signed)
If you are age 51 or older, your body mass index should be between 23-30. Your Body mass index is 51.73 kg/m. If this is out of the aforementioned range listed, please consider follow up with your Primary Care Provider.  If you are age 2 or younger, your body mass index should be between 19-25. Your Body mass index is 51.73 kg/m. If this is out of the aformentioned range listed, please consider follow up with your Primary Care Provider.     You have been scheduled for a colonoscopy. Please follow written instructions given to you at your visit today.  Please pick up your prep supplies at the pharmacy within the next 1-3 days. If you use inhalers (even only as needed), please bring them with you on the day of your procedure.

## 2020-08-12 NOTE — Progress Notes (Signed)
HISTORY OF PRESENT ILLNESS:  Patrick Russell is a 50 y.o. 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  Be sent today by his primary provider regarding colon cancer screening.  As he is high risk, is being evaluated in the office at this time.  Patient does have a history of intermittent problems with reflux as manifested by regurgitation and pyrosis.  For this he takes over-the-counter acid suppressive agents on demand.  These are helpful.  He denies dysphagia.  His GI review of systems is otherwise unremarkable.  No change in bowel habits or bleeding.  There is no family history of colon cancer.  He has not had prior colon cancer screening.  He has completed his COVID vaccination series.  Review of outside blood work from December 2021 shows elevated AST at 94.  Otherwise normal liver tests.  REVIEW OF SYSTEMS:  All non-GI ROS negative unless otherwise stated in the HPI except for back pain  Past Medical History:  Diagnosis Date  . Diabetes mellitus without complication ([place])   . Gout    worse with Allopurinol  . Hypertension   . Obesity   . OSA (obstructive sleep apnea)   . Osteoarthritis   . Subclinical hyperthyroidism   . Tinea pedis   . Tobacco use     History reviewed. No pertinent surgical history.  Social History Patrick Russell  reports that he quit smoking about 7 years ago. His smoking use included cigarettes. He smoked 0.50 packs per day. He has never used smokeless tobacco. He reports current alcohol use of about 5.0 standard drinks of alcohol per week. He reports that he does not use drugs.  family history includes Cancer in his father; Cirrhosis in his brother; Diabetes in his brother, father, and mother; Esophageal varices in his brother; Hypertension in his brother, father, and mother;  Leukemia in his paternal uncle.  No Known Allergies     PHYSICAL EXAMINATION: Vital signs: BP 128/88   Pulse 97   Ht 5\' 8"  (1.727 m)   Wt (!) 340 lb 3.2 oz (154.3 kg)   SpO2 98%   BMI 51.73 kg/m   Constitutional: Pleasant, obese but otherwise well-appearing, no acute distress but does have back discomfort with movement Psychiatric: alert and oriented x3, cooperative Eyes: extraocular movements intact, anicteric, conjunctiva pink Mouth: oral pharynx moist, no lesions Neck: supple no lymphadenopathy Cardiovascular: heart regular rate and rhythm, no murmur Lungs: clear to auscultation bilaterally Abdomen: soft, nontender, nondistended, no obvious ascites, no peritoneal signs, normal bowel sounds, no organomegaly Rectal: Deferred until colonoscopy Extremities: no clubbing, cyanosis, or lower extremity edema bilaterally Skin: no lesions on visible extremities Neuro: No focal deficits.  Cranial nerves intact  ASSESSMENT:  1.  Colon cancer screening.  We discussed in detail today stool based studies (FIT, Cologuard) and optical colonoscopy.  We reviewed the pros and cons as well as relative costs of each strategy.  Patient is interested in optical colonoscopy. 2.  GERD without alarm features 3.  Recently elevated AST.  Likely fatty liver.  Previous liver tests normal 4.  Morbid obesity with BMI greater than 50 (51.7)   PLAN:  1.  Screening colonoscopy.  The patient is HIGH RISK given his comorbidities and body habitus.  Examination will need to be performed in the hospital with monitored anesthesia care.The nature of the procedure, as well as  the risks, benefits, and alternatives were carefully and thoroughly reviewed with the patient. Ample time for discussion and questions allowed. The patient understood, was satisfied, and agreed to proceed. 2.  Hold diabetic medications the day of the procedure to avoid hypoglycemia 3.  Reflux precautions 4.  Weight loss 5.  On-demand acid  suppressive agents 6.  Ongoing general medical care with PCP

## 2020-09-09 ENCOUNTER — Encounter (HOSPITAL_COMMUNITY): Payer: Self-pay | Admitting: Internal Medicine

## 2020-09-10 ENCOUNTER — Encounter (HOSPITAL_COMMUNITY): Payer: Self-pay | Admitting: Internal Medicine

## 2020-09-10 ENCOUNTER — Other Ambulatory Visit: Payer: Self-pay

## 2020-09-11 ENCOUNTER — Other Ambulatory Visit (HOSPITAL_COMMUNITY)
Admission: RE | Admit: 2020-09-11 | Discharge: 2020-09-11 | Disposition: A | Discharge status: Home / Self Care | Payer: 59 | Source: Ambulatory Visit | Attending: Internal Medicine | Admitting: Internal Medicine

## 2020-09-11 DIAGNOSIS — Z01812 Encounter for preprocedural laboratory examination: Secondary | ICD-10-CM | POA: Insufficient documentation

## 2020-09-11 DIAGNOSIS — Z20822 Contact with and (suspected) exposure to covid-19: Secondary | ICD-10-CM | POA: Insufficient documentation

## 2020-09-11 LAB — SARS CORONAVIRUS 2 (TAT 6-24 HRS): SARS Coronavirus 2: NEGATIVE

## 2020-09-15 ENCOUNTER — Encounter (HOSPITAL_COMMUNITY)
Admission: RE | Disposition: A | Discharge status: Home / Self Care | Payer: Self-pay | Source: Home / Self Care | Attending: Internal Medicine

## 2020-09-15 ENCOUNTER — Ambulatory Visit (HOSPITAL_COMMUNITY): Payer: 59 | Admitting: Certified Registered Nurse Anesthetist

## 2020-09-15 ENCOUNTER — Other Ambulatory Visit: Payer: Self-pay

## 2020-09-15 ENCOUNTER — Encounter (HOSPITAL_COMMUNITY): Payer: Self-pay | Admitting: Internal Medicine

## 2020-09-15 ENCOUNTER — Ambulatory Visit (HOSPITAL_COMMUNITY)
Admission: RE | Admit: 2020-09-15 | Discharge: 2020-09-15 | Disposition: A | Discharge status: Home / Self Care | Payer: 59 | Attending: Internal Medicine | Admitting: Internal Medicine

## 2020-09-15 DIAGNOSIS — Z87891 Personal history of nicotine dependence: Secondary | ICD-10-CM | POA: Diagnosis not present

## 2020-09-15 DIAGNOSIS — Z1211 Encounter for screening for malignant neoplasm of colon: Secondary | ICD-10-CM | POA: Diagnosis not present

## 2020-09-15 DIAGNOSIS — K5730 Diverticulosis of large intestine without perforation or abscess without bleeding: Secondary | ICD-10-CM | POA: Diagnosis not present

## 2020-09-15 DIAGNOSIS — Z8379 Family history of other diseases of the digestive system: Secondary | ICD-10-CM | POA: Insufficient documentation

## 2020-09-15 DIAGNOSIS — Z8249 Family history of ischemic heart disease and other diseases of the circulatory system: Secondary | ICD-10-CM | POA: Insufficient documentation

## 2020-09-15 DIAGNOSIS — E119 Type 2 diabetes mellitus without complications: Secondary | ICD-10-CM | POA: Insufficient documentation

## 2020-09-15 DIAGNOSIS — D122 Benign neoplasm of ascending colon: Secondary | ICD-10-CM | POA: Diagnosis not present

## 2020-09-15 DIAGNOSIS — Z806 Family history of leukemia: Secondary | ICD-10-CM | POA: Insufficient documentation

## 2020-09-15 DIAGNOSIS — D124 Benign neoplasm of descending colon: Secondary | ICD-10-CM | POA: Diagnosis not present

## 2020-09-15 DIAGNOSIS — K219 Gastro-esophageal reflux disease without esophagitis: Secondary | ICD-10-CM | POA: Diagnosis not present

## 2020-09-15 HISTORY — PX: COLONOSCOPY WITH PROPOFOL: SHX5780

## 2020-09-15 HISTORY — PX: POLYPECTOMY: SHX5525

## 2020-09-15 LAB — GLUCOSE, CAPILLARY: Glucose-Capillary: 138 mg/dL — ABNORMAL HIGH (ref 70–99)

## 2020-09-15 SURGERY — COLONOSCOPY WITH PROPOFOL
Anesthesia: Monitor Anesthesia Care

## 2020-09-15 MED ORDER — PROPOFOL 10 MG/ML IV BOLUS
INTRAVENOUS | Status: DC | PRN
  Administered 2020-09-15: 50 mg via INTRAVENOUS

## 2020-09-15 MED ORDER — LACTATED RINGERS IV SOLN
INTRAVENOUS | Status: AC | PRN
  Administered 2020-09-15: 10 mL/h via INTRAVENOUS

## 2020-09-15 MED ORDER — PROPOFOL 500 MG/50ML IV EMUL
INTRAVENOUS | Status: DC | PRN
  Administered 2020-09-15: 125 ug/kg/min via INTRAVENOUS

## 2020-09-15 MED ORDER — PROPOFOL 500 MG/50ML IV EMUL
INTRAVENOUS | Status: AC
  Filled 2020-09-15: qty 50

## 2020-09-15 MED ORDER — SODIUM CHLORIDE 0.9 % IV SOLN: INTRAVENOUS | Status: DC

## 2020-09-15 MED ORDER — ONDANSETRON HCL 4 MG/2ML IJ SOLN
INTRAMUSCULAR | Status: DC | PRN
  Administered 2020-09-15: 4 mg via INTRAVENOUS

## 2020-09-15 SURGICAL SUPPLY — 21 items

## 2020-09-15 NOTE — Op Note (Signed)
[facility] Patient Name: Patrick Russell Procedure Date: 09/15/2020 MRN: [id number] Attending MD: Wilhemina Bonito. Marina Goodell , MD Date of Birth: [birth year] CSN: [id number] Age: 50 Admit Type: Outpatient Procedure:                Colonoscopy with cold snare polypectomy x 3 Indications:              Screening for colorectal malignant neoplasm Providers:                Wilhemina Bonito. Marina Goodell, MD, Shelda Jakes, RN, Rosilyn Mings, Technician Referring MD:             Porfirio Oar, PA Medicines:                Monitored Anesthesia Care Complications:            No immediate complications. Estimated blood loss:                            None. Estimated Blood Loss:     Estimated blood loss: none. Procedure:                Pre-Anesthesia Assessment:                           - Prior to the procedure, a History and Physical                            was performed, and patient medications and                            allergies were reviewed. The patient's tolerance of                            previous anesthesia was also reviewed. The risks                            and benefits of the procedure and the sedation                            options and risks were discussed with the patient.                            All questions were answered, and informed consent                            was obtained. Prior Anticoagulants: The patient has                            taken no previous anticoagulant or antiplatelet                            agents. ASA Grade Assessment: II - A patient with  mild systemic disease. After reviewing the risks                            and benefits, the patient was deemed in                            satisfactory condition to undergo the procedure.                           After obtaining informed consent, the colonoscope                            was passed under direct vision. Throughout the                             procedure, the patient's blood pressure, pulse, and                            oxygen saturations were monitored continuously. The                            CF-HQ190L ([id number]) Olympus colonoscope was                            introduced through the anus and advanced to the the                            cecum, identified by appendiceal orifice and                            ileocecal valve. The ileocecal valve, appendiceal                            orifice, and rectum were photographed. The quality                            of the bowel preparation was excellent. The                            colonoscopy was performed without difficulty. The                            patient tolerated the procedure well. The bowel                            preparation used was SUPREP via split dose                            instruction. Scope In: 9:33:00 AM Scope Out: 9:59:00 AM Scope Withdrawal Time: 0 hours 23 minutes 27 seconds  Total Procedure Duration: 0 hours 26 minutes 0 seconds  Findings:      Three polyps were found in the descending colon and ascending colon. The       polyps were 1 to 3 mm in size. These  polyps were removed with a cold       snare. Resection and retrieval were complete.      Scattered small-mouthed diverticula were found in the entire colon.      The exam was otherwise without abnormality on direct and retroflexion       views. Impression:               - Three 1 to 3 mm polyps in the descending colon                            and in the ascending colon, removed with a cold                            snare. Resected and retrieved.                           - Diverticulosis in the entire examined colon.                           - The examination was otherwise normal on direct                            and retroflexion views. Moderate Sedation:      none Recommendation:           - Repeat colonoscopy in 5 years for surveillance.                            - Patient has a contact number available for                            emergencies. The signs and symptoms of potential                            delayed complications were discussed with the                            patient. Return to normal activities tomorrow.                            Written discharge instructions were provided to the                            patient.                           - Resume previous diet.                           - Continue present medications.                           - Await pathology results. Procedure Code(s):        --- Professional ---                           [phone], Colonoscopy, flexible; with  removal of                            tumor(s), polyp(s), or other lesion(s) by snare                            technique Diagnosis Code(s):        --- Professional ---                           Z12.11, Encounter for screening for malignant                            neoplasm of colon                           K63.5, Polyp of colon                           K57.30, Diverticulosis of large intestine without                            perforation or abscess without bleeding CPT copyright 2019 [employer]. All rights reserved. The codes documented in this report are preliminary and upon coder review may  be revised to meet current compliance requirements. Wilhemina Bonito. Marina Goodell, MD 09/15/2020 10:21:34 AM This report has been signed electronically. Number of Addenda: 0

## 2020-09-15 NOTE — Anesthesia Postprocedure Evaluation (Signed)
Anesthesia Post Note  Patient: Patrick Russell  Procedure(s) Performed: COLONOSCOPY WITH PROPOFOL (N/A ) POLYPECTOMY     Patient location during evaluation: Endoscopy Anesthesia Type: MAC Level of consciousness: awake Pain management: pain level controlled Vital Signs Assessment: post-procedure vital signs reviewed and stable Respiratory status: spontaneous breathing, nonlabored ventilation, respiratory function stable and patient connected to nasal cannula oxygen Cardiovascular status: stable and blood pressure returned to baseline Postop Assessment: no apparent nausea or vomiting Anesthetic complications: no   No complications documented.  Last Vitals:  Vitals:   09/15/20 1020 09/15/20 1030  BP: (!) 145/97 (!) 164/94  Pulse: 86 79  Resp: 16 18  Temp:    SpO2: 94% 94%    Last Pain:  Vitals:   09/15/20 1030  TempSrc:   PainSc: 0-No pain                 Nabiha Planck P Pamala Hayman

## 2020-09-15 NOTE — Anesthesia Preprocedure Evaluation (Signed)
Anesthesia Evaluation  Patient identified by MRN, date of birth, ID band Patient awake    Reviewed: Allergy & Precautions, NPO status , Patient's Chart, lab work & pertinent test results  Airway Mallampati: III  TM Distance: >3 FB Neck ROM: Full    Dental no notable dental hx.    Pulmonary sleep apnea (Patient denies) , former smoker,    Pulmonary exam normal breath sounds clear to auscultation       Cardiovascular hypertension, Pt. on medications Normal cardiovascular exam Rhythm:Regular Rate:Normal     Neuro/Psych negative neurological ROS  negative psych ROS   GI/Hepatic negative GI ROS, Neg liver ROS,   Endo/Other  diabetes, Oral Hypoglycemic AgentsMorbid obesity  Renal/GU negative Renal ROS     Musculoskeletal  (+) Arthritis , Gout   Abdominal (+) + obese,   Peds  Hematology HLD   Anesthesia Other Findings Colon cancer screening  Reproductive/Obstetrics                             Anesthesia Physical Anesthesia Plan  ASA: IV  Anesthesia Plan: MAC   Post-op Pain Management:    Induction: Intravenous  PONV Risk Score and Plan: 1 and Propofol infusion and Treatment may vary due to age or medical condition  Airway Management Planned: Simple Face Mask  Additional Equipment:   Intra-op Plan:   Post-operative Plan:   Informed Consent: I have reviewed the patients History and Physical, chart, labs and discussed the procedure including the risks, benefits and alternatives for the proposed anesthesia with the patient or authorized representative who has indicated his/her understanding and acceptance.     Dental advisory given  Plan Discussed with: CRNA  Anesthesia Plan Comments:         Anesthesia Quick Evaluation

## 2020-09-15 NOTE — Transfer of Care (Signed)
Immediate Anesthesia Transfer of Care Note  Patient: Patrick Russell  Procedure(s) Performed: COLONOSCOPY WITH PROPOFOL (N/A ) POLYPECTOMY  Patient Location: PACU and Endoscopy Unit  Anesthesia Type:MAC  Level of Consciousness: awake, alert  and oriented  Airway & Oxygen Therapy: Patient Spontanous Breathing and Patient connected to face mask oxygen  Post-op Assessment: Report given to RN and Post -op Vital signs reviewed and stable  Post vital signs: Reviewed and stable  Last Vitals:  Vitals Value Taken Time  BP    Temp    Pulse    Resp    SpO2      Last Pain:  Vitals:   09/15/20 0900  TempSrc: Oral  PainSc: 0-No pain         Complications: No complications documented.

## 2020-09-15 NOTE — Anesthesia Procedure Notes (Signed)
Procedure Name: MAC Date/Time: 09/15/2020 9:16 AM Performed by: Maxwell Caul, CRNA Pre-anesthesia Checklist: Patient identified, Emergency Drugs available, Suction available and Patient being monitored Oxygen Delivery Method: Simple face mask

## 2020-09-15 NOTE — Discharge Instructions (Signed)

## 2020-09-15 NOTE — H&P (Signed)
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  Be sent today by his primary provider regarding colon cancer screening.  As he is high risk, is being evaluated in the office at this time.  Patient does have a history of intermittent problems with reflux as manifested by regurgitation and pyrosis.  For this he takes over-the-counter acid suppressive agents on demand.  These are helpful.  He denies dysphagia.  His GI review of systems is otherwise unremarkable.  No change in bowel habits or bleeding.  There is no family history of colon cancer.  He has not had prior colon cancer screening.  He has completed his COVID vaccination series.  Review of outside blood work from December 2021 shows elevated AST at 94.  Otherwise normal liver tests.  REVIEW OF SYSTEMS:  All non-GI ROS negative unless otherwise stated in the HPI except for back pain      Past Medical History:  Diagnosis Date  . Diabetes mellitus without complication ([place])   . Gout    worse with Allopurinol  . Hypertension   . Obesity   . OSA (obstructive sleep apnea)   . Osteoarthritis   . Subclinical hyperthyroidism   . Tinea pedis   . Tobacco use     History reviewed. No pertinent surgical history.  Social History Patrick Russell  reports that he quit smoking about 7 years ago. His smoking use included cigarettes. He smoked 0.50 packs per day. He has never used smokeless tobacco. He reports current alcohol use of about 5.0 standard drinks of alcohol per week. He reports that he does not use drugs.  family history includes Cancer in his father; Cirrhosis in his brother; Diabetes in his brother, father, and mother; Esophageal varices in his brother; Hypertension in his brother, father, and mother;  Leukemia in his paternal uncle.  No Known Allergies     PHYSICAL EXAMINATION: Vital signs: BP 128/88   Pulse 97   Ht 5\' 8"  (1.727 m)   Wt (!) 340 lb 3.2 oz (154.3 kg)   SpO2 98%   BMI 51.73 kg/m   Constitutional: Pleasant, obese but otherwise well-appearing, no acute distress but does have back discomfort with movement Psychiatric: alert and oriented x3, cooperative Eyes: extraocular movements intact, anicteric, conjunctiva pink Mouth: oral pharynx moist, no lesions Neck: supple no lymphadenopathy Cardiovascular: heart regular rate and rhythm, no murmur Lungs: clear to auscultation bilaterally Abdomen: soft, nontender, nondistended, no obvious ascites, no peritoneal signs, normal bowel sounds, no organomegaly Rectal: Deferred until colonoscopy Extremities: no clubbing, cyanosis, or lower extremity edema bilaterally Skin: no lesions on visible extremities Neuro: No focal deficits.  Cranial nerves intact  ASSESSMENT:  1.  Colon cancer screening.  We discussed in detail today stool based studies (FIT, Cologuard) and optical colonoscopy.  We reviewed the pros and cons as well as relative costs of each strategy.  Patient is interested in optical colonoscopy. 2.  GERD without alarm features 3.  Recently elevated AST.  Likely fatty liver.  Previous liver tests normal 4.  Morbid obesity with BMI greater than 50 (51.7)   PLAN:  1.  Screening colonoscopy.  The patient is HIGH RISK given his comorbidities and body habitus.  Examination will need to be performed in the hospital with monitored anesthesia care.The nature of the procedure, as well as the risks,  benefits, and alternatives were carefully and thoroughly reviewed with the patient. Ample time for discussion and questions allowed. The patient understood, was satisfied, and agreed to proceed. 2.  Hold diabetic medications the day of the procedure to avoid hypoglycemia 3.  Reflux precautions 4.  Weight loss 5.  On-demand  acid suppressive agents 6.  Ongoing general medical care with PCP  Patient was recently evaluated in the office for screening colonoscopy as outlined above.  He presents to the hospital today for the same.  He was evaluated at the bedside preprocedure.  No interval problems, additions, or deletions.  He completed the bowel preparation.The nature of the procedure, as well as the risks, benefits, and alternatives were carefully and thoroughly reviewed with the patient. Ample time for discussion and questions allowed. The patient understood, was satisfied, and agreed to proceed.  Docia Chuck. Geri Seminole., M.D. [facility] Division of Gastroenterology

## 2020-09-16 ENCOUNTER — Encounter (HOSPITAL_COMMUNITY): Payer: Self-pay | Admitting: Internal Medicine

## 2020-09-16 LAB — SURGICAL PATHOLOGY

## 2021-06-14 ENCOUNTER — Encounter: Payer: Self-pay | Admitting: Physician Assistant

## 2021-06-14 ENCOUNTER — Ambulatory Visit: Payer: 59 | Admitting: Physician Assistant

## 2021-06-14 ENCOUNTER — Ambulatory Visit: Payer: Self-pay

## 2021-06-14 ENCOUNTER — Other Ambulatory Visit: Payer: Self-pay

## 2021-06-14 VITALS — Ht 68.0 in | Wt 327.0 lb

## 2021-06-14 DIAGNOSIS — M25561 Pain in right knee: Secondary | ICD-10-CM | POA: Diagnosis not present

## 2021-06-14 DIAGNOSIS — M25562 Pain in left knee: Secondary | ICD-10-CM

## 2021-06-14 DIAGNOSIS — G8929 Other chronic pain: Secondary | ICD-10-CM

## 2021-06-14 MED ORDER — MELOXICAM 15 MG PO TABS: 15.0000 mg | ORAL_TABLET | Freq: Every day | ORAL | 2 refills | Status: DC

## 2021-06-14 NOTE — Progress Notes (Addendum)
Office Visit Note   Patient: Patrick Russell           Date of Birth: 1971/02/11           MRN: [id number] Visit Date: 06/14/2021              Requested by: Porfirio Oar, PA [address] [place],  [place] [address] PCP: Porfirio Oar, PA  Chief Complaint  Patient presents with   Left Knee - Pain   Right Knee - Pain      HPI: Patient is a pleasant 51 year old gentleman with a chief complaint o bilateral, right greater than left knee pain.  This is been going on for several months.  He does not have any specific injury however he admits he has put on some weight about 50 pounds because of a back issue.  He says the knee issue is definitely separate from the back.  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  He has not tried anything in specific to improve this.  He is a type II diabetic.  Assessment & Plan: Visit Diagnoses:  1. Chronic pain of both knees     Plan: 51 year old gentleman with a BMI of 80 with recent issues with his back and weight gain.  He has bilateral knee arthritis which creates grinding especially in his right knee.  We discussed the natural history of this as well as options.  He would like to defer any injections today.  I recommended Voltaren gel.  We will also start him on a course of meloxicam 1 daily.  Have also given him information about close chain quadricep strengthening exercises.  Offered him physical therapy but he would like to do things on his own.  We also discussed weight loss and he understands this is definitely a contributing issue.  We will follow-up in 1 month  Follow-Up Instructions: No follow-ups on file.   Ortho Exam  Patient is alert, oriented, no adenopathy, well-dressed, normal affect, normal respiratory effort. Bilateral knees no effusion no cellulitis.  On the right knee he does have significant patellofemoral grinding with range of motion.  Medial compartment is somewhat tender to palpation more than lateral is  not tender at all.  Good varus valgus stability. Left knee has minimal patellofemoral grinding with range of motion.  Again some tenderness over the medial joint line none over the lateral joint line good varus valgus stability  Imaging: XR KNEE 3 VIEW LEFT  Result Date: 06/14/2021 Radiographs of his left knee were reviewed by me today.  Some slight varus alignment.  He does have joint space narrowing specifically over the medial joint line as well as the patellofemoral joint with a total associated sclerosis.  Cannot appreciate any osseous injuries  XR KNEE 3 VIEW RIGHT  Result Date: 06/14/2021 Radiographs of his right knee were reviewed today.  Slight varus alignment.  He does have joint space narrowing medial greater than laterally with associated sclerosis and some subchondral cyst.  Patellofemoral joint also demonstrates sclerosis and joint narrowing.  No acute osseous injuries  No images are attached to the encounter.  Labs: Lab Results  Component Value Date   HGBA1C 6.4 (H) 01/10/2018   HGBA1C 6.0 (H) 09/07/2017   HGBA1C 6.2 (H) 02/11/2017   ESRSEDRATE 14 09/07/2017   LABURIC 7.5 02/15/2018   LABURIC 6.7 01/10/2018   LABURIC 9.9 (H) 11/17/2017   GRAMSTAIN Rare 04/16/2016   GRAMSTAIN WBC present-predominately PMN 04/16/2016   GRAMSTAIN  No Squamous Epithelial Cells Seen 04/16/2016   GRAMSTAIN No Organisms Seen 04/16/2016   LABORGA ENTEROBACTER AEROGENES 04/16/2016     Lab Results  Component Value Date   ALBUMIN 4.3 01/10/2018   ALBUMIN 4.7 09/07/2017   ALBUMIN 4.6 02/11/2017    No results found for: MG No results found for: VD25OH  No results found for: PREALBUMIN CBC EXTENDED Latest Ref Rng & Units 03/28/2019 01/10/2018 02/11/2017  WBC 4.0 - 10.5 K/uL 11.1(H) 4.7 6.7  RBC 4.22 - 5.81 MIL/uL 5.45 5.25 5.83(H)  HGB 13.0 - 17.0 g/dL 29.5 28.4 13.2  HCT 44.0 - 52.0 % 45.3 43.4 46.3  PLT 150 - 400 K/uL 339 312 370  NEUTROABS 1.4 - 7.0 x10E3/uL - - 4.1  LYMPHSABS 0.7 - 3.1  x10E3/uL - - 1.8     Body mass index is 49.72 kg/m.  Orders:  Orders Placed This Encounter  Procedures   XR KNEE 3 VIEW LEFT   XR KNEE 3 VIEW RIGHT   No orders of the defined types were placed in this encounter.    Procedures: No procedures performed  Clinical Data: No additional findings.  ROS:  All other systems negative, except as noted in the HPI. Review of Systems  Objective: Vital Signs: Ht 5\' 8"  (1.727 m)    Wt (!) 327 lb (148.3 kg)    BMI 49.72 kg/m   Specialty Comments:  No specialty comments available.  PMFS History: Patient Active Problem List   Diagnosis Date Noted   Colon cancer screening    Benign neoplasm of ascending colon    Benign neoplasm of descending colon    Low TSH level 02/13/2017   Osteoarthritis of left knee 02/11/2017   Back pain 02/11/2017   Primary osteoarthritis of knees, bilateral 06/24/2016   Prediabetes 11/22/2015   Gout 11/01/2015   OSA (obstructive sleep apnea) 10/13/2015   Low testosterone 05/02/2015   Hypertension 07/16/2013   History of tobacco use 07/16/2013   Insomnia 07/16/2013   Obesity    Past Medical History:  Diagnosis Date   Diabetes mellitus without complication (HCC)    Gout    worse with Allopurinol   Hypertension    Obesity    OSA (obstructive sleep apnea)    Osteoarthritis    Subclinical hyperthyroidism    Tinea pedis    Tobacco use     Family History  Problem Relation Age of Onset   Hypertension Mother    Diabetes Mother        controlled with weight loss   Cancer Father        prostate   Diabetes Father        controlled with weight loss   Hypertension Father    Diabetes Brother    Hypertension Brother    Esophageal varices Brother    Cirrhosis Brother    Leukemia Paternal Uncle    Colon cancer Neg Hx    Stomach cancer Neg Hx    Liver cancer Neg Hx    Pancreatic cancer Neg Hx    Esophageal cancer Neg Hx     Past Surgical History:  Procedure Laterality Date   COLONOSCOPY WITH  PROPOFOL N/A 09/15/2020   Procedure: COLONOSCOPY WITH PROPOFOL;  Surgeon: Hilarie Fredrickson, MD;  Location: [facility];  Service: Endoscopy;  Laterality: N/A;   POLYPECTOMY  09/15/2020   Procedure: POLYPECTOMY;  Surgeon: Hilarie Fredrickson, MD;  Location: [facility];  Service: Endoscopy;;   Social History   Occupational History  Occupation: Systems analyst: [employer]  Tobacco Use   Smoking status: Former    Packs/day: 0.50    Types: Cigarettes    Quit date: 07/07/2013    Years since quitting: 7.9   Smokeless tobacco: Never   Tobacco comments:    Quit 2014  Vaping Use   Vaping Use: Never used  Substance and Sexual Activity   Alcohol use: Yes    Alcohol/week: 5.0 standard drinks    Types: 5 Cans of beer per week   Drug use: No   Sexual activity: Yes    Partners: Female

## 2021-07-15 ENCOUNTER — Ambulatory Visit: Payer: 59 | Admitting: Physician Assistant

## 2021-09-15 ENCOUNTER — Other Ambulatory Visit: Payer: Self-pay | Admitting: Physician Assistant

## 2021-12-01 ENCOUNTER — Other Ambulatory Visit: Payer: Self-pay | Admitting: Physician Assistant

## 2023-03-25 ENCOUNTER — Encounter (HOSPITAL_BASED_OUTPATIENT_CLINIC_OR_DEPARTMENT_OTHER): Payer: Self-pay

## 2023-03-25 ENCOUNTER — Emergency Department (HOSPITAL_BASED_OUTPATIENT_CLINIC_OR_DEPARTMENT_OTHER)
Admission: EM | Admit: 2023-03-25 | Discharge: 2023-03-25 | Disposition: A | Discharge status: Home / Self Care | Payer: 59 | Attending: Emergency Medicine | Admitting: Emergency Medicine

## 2023-03-25 ENCOUNTER — Emergency Department (HOSPITAL_BASED_OUTPATIENT_CLINIC_OR_DEPARTMENT_OTHER): Payer: 59

## 2023-03-25 DIAGNOSIS — I10 Essential (primary) hypertension: Secondary | ICD-10-CM | POA: Insufficient documentation

## 2023-03-25 DIAGNOSIS — E119 Type 2 diabetes mellitus without complications: Secondary | ICD-10-CM | POA: Insufficient documentation

## 2023-03-25 DIAGNOSIS — M109 Gout, unspecified: Secondary | ICD-10-CM

## 2023-03-25 DIAGNOSIS — Z79899 Other long term (current) drug therapy: Secondary | ICD-10-CM | POA: Insufficient documentation

## 2023-03-25 DIAGNOSIS — D72829 Elevated white blood cell count, unspecified: Secondary | ICD-10-CM | POA: Diagnosis not present

## 2023-03-25 DIAGNOSIS — R079 Chest pain, unspecified: Secondary | ICD-10-CM | POA: Insufficient documentation

## 2023-03-25 DIAGNOSIS — M7662 Achilles tendinitis, left leg: Secondary | ICD-10-CM | POA: Insufficient documentation

## 2023-03-25 DIAGNOSIS — M25561 Pain in right knee: Secondary | ICD-10-CM | POA: Diagnosis not present

## 2023-03-25 LAB — CBC WITH DIFFERENTIAL/PLATELET
Abs Immature Granulocytes: 0.04 10*3/uL (ref 0.00–0.07)
Basophils Absolute: 0 10*3/uL (ref 0.0–0.1)
Basophils Relative: 0 %
Eosinophils Absolute: 0 10*3/uL (ref 0.0–0.5)
Eosinophils Relative: 0 %
HCT: 44.1 % (ref 39.0–52.0)
Hemoglobin: 14.3 g/dL (ref 13.0–17.0)
Immature Granulocytes: 0 %
Lymphocytes Relative: 10 %
Lymphs Abs: 1.1 10*3/uL (ref 0.7–4.0)
MCH: 26.5 pg (ref 26.0–34.0)
MCHC: 32.4 g/dL (ref 30.0–36.0)
MCV: 81.8 fL (ref 80.0–100.0)
Monocytes Absolute: 1.1 10*3/uL — ABNORMAL HIGH (ref 0.1–1.0)
Monocytes Relative: 10 %
Neutro Abs: 8.5 10*3/uL — ABNORMAL HIGH (ref 1.7–7.7)
Neutrophils Relative %: 80 %
Platelets: 320 10*3/uL (ref 150–400)
RBC: 5.39 MIL/uL (ref 4.22–5.81)
RDW: 14.5 % (ref 11.5–15.5)
WBC: 10.7 10*3/uL — ABNORMAL HIGH (ref 4.0–10.5)
nRBC: 0 % (ref 0.0–0.2)

## 2023-03-25 LAB — COMPREHENSIVE METABOLIC PANEL
ALT: 21 U/L (ref 0–44)
AST: 24 U/L (ref 15–41)
Albumin: 4.6 g/dL (ref 3.5–5.0)
Alkaline Phosphatase: 75 U/L (ref 38–126)
Anion gap: 12 (ref 5–15)
BUN: 12 mg/dL (ref 6–20)
CO2: 29 mmol/L (ref 22–32)
Calcium: 10 mg/dL (ref 8.9–10.3)
Chloride: 97 mmol/L — ABNORMAL LOW (ref 98–111)
Creatinine, Ser: 0.86 mg/dL (ref 0.61–1.24)
GFR, Estimated: >60 mL/min (ref >60)
Glucose, Bld: 112 mg/dL — ABNORMAL HIGH (ref 70–99)
Potassium: 3.1 mmol/L — ABNORMAL LOW (ref 3.5–5.1)
Sodium: 138 mmol/L (ref 135–145)
Total Bilirubin: 1.2 mg/dL (ref 0.3–1.2)
Total Protein: 7.4 g/dL (ref 6.5–8.1)

## 2023-03-25 LAB — TROPONIN I (HIGH SENSITIVITY)
Troponin I (High Sensitivity): 5 ng/L (ref <18)
Troponin I (High Sensitivity): 5 ng/L (ref <18)

## 2023-03-25 MED ORDER — INDOMETHACIN 25 MG PO CAPS: 50.0000 mg | ORAL_CAPSULE | Freq: Three times a day (TID) | ORAL | 0 refills | Status: AC

## 2023-03-25 MED ORDER — COLCHICINE 0.6 MG PO CAPS: ORAL_CAPSULE | ORAL | 1 refills | Status: AC

## 2023-03-25 MED ORDER — HYDROMORPHONE HCL 1 MG/ML IJ SOLN
0.5000 mg | Freq: Once | INTRAMUSCULAR | Status: AC
  Administered 2023-03-25: 0.5 mg via INTRAVENOUS
  Filled 2023-03-25: qty 1

## 2023-03-25 MED ORDER — OXYCODONE HCL 5 MG PO TABS
5.0000 mg | ORAL_TABLET | Freq: Once | ORAL | Status: AC
  Administered 2023-03-25: 5 mg via ORAL
  Filled 2023-03-25: qty 1

## 2023-03-25 MED ORDER — CYCLOBENZAPRINE HCL 10 MG PO TABS
10.0000 mg | ORAL_TABLET | Freq: Once | ORAL | Status: AC
  Administered 2023-03-25: 10 mg via ORAL
  Filled 2023-03-25: qty 1

## 2023-03-25 MED ORDER — CYCLOBENZAPRINE HCL 10 MG PO TABS: 10.0000 mg | ORAL_TABLET | Freq: Two times a day (BID) | ORAL | 0 refills | Status: AC | PRN

## 2023-03-25 MED ORDER — POTASSIUM CHLORIDE CRYS ER 20 MEQ PO TBCR
40.0000 meq | EXTENDED_RELEASE_TABLET | Freq: Once | ORAL | Status: AC
  Administered 2023-03-25: 40 meq via ORAL
  Filled 2023-03-25: qty 2

## 2023-03-25 MED ORDER — DEXAMETHASONE SODIUM PHOSPHATE 10 MG/ML IJ SOLN
10.0000 mg | Freq: Once | INTRAMUSCULAR | Status: AC
  Administered 2023-03-25: 10 mg via INTRAVENOUS
  Filled 2023-03-25: qty 1

## 2023-03-25 NOTE — ED Provider Notes (Signed)
[facility]PARTMENT AT [facility] Provider Note   CSN: [id number] Arrival date & time: 03/25/23  0865     History {Add pertinent medical, surgical, social history, OB history to HPI:1} Chief Complaint  Patient presents with   Multiple complaints/pain & swelling    Patrick Russell is a 52 y.o. male.  HPI     52yo male with history of DM, htn, OSA,osteoarthritis, chronic back pain, morbid obesity who presents with concern for right knee pain, left Achilles pain, and right sided chest pain.   Right knee swelling, hx of gout 2 days severe pain, left achilles 2 days Right chest pain worse, with moving arm, feels it when lifts arm going chest towards back, chest hurts when raising the right arm  Has back pain, more chronic and ongoing Has not had this much pain at one time  No fever No falls or trauma Thinks RA or gout Not sure if diagnosed with RA No nausea or vomiting No shortness of breath No cough No numbness/weakness/loss control bowel or bladder No hx of infections in join, no hx of IVDU 11/10 pain No hx of dvt/pe, just did blood work a few weeks ago Not using them more No long trips/immobilation-1 month ago flew to [place] [employer]         Past Medical History:  Diagnosis Date   Diabetes mellitus without complication (HCC)    Gout    worse with Allopurinol   Hypertension    Obesity    OSA (obstructive sleep apnea)    Osteoarthritis    Subclinical hyperthyroidism    Tinea pedis    Tobacco use      Home Medications Prior to Admission medications   Medication Sig Start Date End Date Taking? Authorizing Provider  allopurinol (ZYLOPRIM) 100 MG tablet Take 100 mg by mouth daily.    [provider]  amLODipine (NORVASC) 10 MG tablet Take 1 tablet (10 mg total) by mouth daily. Patient taking differently: Take 10 mg by mouth at bedtime. 01/10/18   Wallis Bamberg, PA-C  atorvastatin (LIPITOR) 40 MG tablet Take 1 tablet (40 mg total) by mouth  daily. 01/10/18   Wallis Bamberg, PA-C  Cholecalciferol (VITAMIN D3) 1.25 MG (50000 UT) CAPS Take 50,000 Units by mouth every Thursday.    [provider]  cholecalciferol (VITAMIN D3) 25 MCG (1000 UNIT) tablet Take 2,000 Units by mouth daily.    [provider]  Colchicine (MITIGARE) 0.6 MG CAPS Take 2 tablets a the start of a gout attack. Take 1 more tablet an hour later. Do not repeat dosing for 2 days. Patient taking differently: Take 0.6-1.2 mg by mouth See admin instructions. Take 1.2 mg a the start of a gout attack. Take 0.6 mg an hour later. Do not repeat dosing for 2 days. 01/10/18   Wallis Bamberg, PA-C  Cyanocobalamin (B-12) 3000 MCG CAPS Take 6,000 mcg by mouth daily.    [provider]  diphenhydrAMINE HCl (ZZZQUIL) 50 MG/30ML LIQD Take 50 mg by mouth at bedtime as needed (sleep).    [provider]  Garlic 1000 MG CAPS Take 2,000 mg by mouth daily.    [provider]  ibuprofen (ADVIL) 200 MG tablet Take 400 mg by mouth every 6 (six) hours as needed for headache or moderate pain.    [provider]  lisinopril (PRINIVIL,ZESTRIL) 20 MG tablet Take 1 tablet (20 mg total) by mouth daily. 01/10/18   Wallis Bamberg, PA-C  meloxicam (MOBIC) 15 MG tablet  TAKE 1 TABLET BY MOUTH EVERY DAY 12/01/21   Persons, [place], PA  metFORMIN (GLUCOPHAGE-XR) 500 MG 24 hr tablet Take 500 mg by mouth 2 (two) times daily. 08/19/20   [provider]  Turmeric 500 MG CAPS Take 500 mg by mouth once a week.    [provider]  valsartan (DIOVAN) 160 MG tablet Take 160 mg by mouth daily. 06/01/20   [provider]      Allergies    Patient has no known allergies.    Review of Systems   Review of Systems  Physical Exam Updated Vital Signs BP 139/84 (BP Location: Right Arm)   Pulse (!) 101   Temp 98 F (36.7 C) (Oral)   Resp 18   SpO2 95%  Physical Exam Vitals and nursing note reviewed.  Constitutional:      General: He is not in acute  distress.    Appearance: He is well-developed. He is not diaphoretic.  HENT:     Head: Normocephalic and atraumatic.  Eyes:     Conjunctiva/sclera: Conjunctivae normal.  Cardiovascular:     Rate and Rhythm: Normal rate and regular rhythm.     Heart sounds: Normal heart sounds. No murmur heard.    No friction rub. No gallop.  Pulmonary:     Effort: Pulmonary effort is normal. No respiratory distress.     Breath sounds: Normal breath sounds. No wheezing or rales.  Abdominal:     General: There is no distension.     Palpations: Abdomen is soft.     Tenderness: There is no abdominal tenderness. There is no guarding.  Musculoskeletal:        General: Swelling (right knee) and tenderness (right knee, left achilles and ankle) present.     Cervical back: Normal range of motion.  Skin:    General: Skin is warm and dry.  Neurological:     Mental Status: He is alert and oriented to person, place, and time.     ED Results / Procedures / Treatments   Labs (all labs ordered are listed, but only abnormal results are displayed) Labs Reviewed - No data to display  EKG None  Radiology No results found.  Procedures Procedures  {Document cardiac monitor, telemetry assessment procedure when appropriate:1}  Medications Ordered in ED Medications - No data to display  ED Course/ Medical Decision Making/ A&P   {   Click here for ABCD2, HEART and other calculatorsREFRESH Note before signing :1}                                  52yo male with history of DM, htn, OSA,osteoarthritis, chronic back pain, morbid obesity who presents with concern for right knee pain, left Achilles pain, and right sided chest pain.  Also notes ongoing low back pain.  Differential diagnosis for joint pain includes septic arthritis, rheumatoid arthritis, gout, osteoarthritis.  He has normal pulses bilateral upper and lower extremities, low suspicion for acute arterial thrombus or aortic dissection, no asymmetric  calf swelling or pain in this area to suggest DVT.  X-ray of the right knee completed showing***, x-ray of the left ankle shows***  He is afebrile, denies risk factors for septic arthritis, has no erythema, and is able to range his knee, and overall low suspicion for septic arthritis.  With his history of gout, consider this a possibility, also consider other rheumatologic etiology given multiple areas involved,  patient reporting that he had this as a previous diagnosis although he has not seen a rheumatologist.  He also reports right sided chest pain.  EKG was completed and personally about interpreted by me shows no evidence of pericarditis or acute ST elevation MI.  Labs are completed and personally about interpreted by me shows***.  Low clinical suspicion for aortic dissection, PE.  He does have pain with movements of his arm, and feel musculoskeletal etiology is most likely.  X-ray of the chest was obtained and personally read interpreted by me showing***.  {Document critical care time when appropriate:1} {Document review of labs and clinical decision tools ie heart score, Chads2Vasc2 etc:1}  {Document your independent review of radiology images, and any outside records:1} {Document your discussion with family members, caretakers, and with consultants:1} {Document social determinants of health affecting pt's care:1} {Document your decision making why or why not admission, treatments were needed:1} Final Clinical Impression(s) / ED Diagnoses Final diagnoses:  None    Rx / DC Orders ED Discharge Orders     None

## 2023-03-25 NOTE — ED Triage Notes (Signed)
He c/o numerous c/o pain, including, but not limited to, low back; bilat. Legs, right upper chest. His wife is with him.

## 2023-03-25 NOTE — ED Notes (Signed)
Patient given discharge instructions. Questions were answered. Patient verbalized understanding of discharge instructions and care at home.
